# Patient Record
Sex: Female | Born: 1985 | Hispanic: No | Marital: Married | Smoking: Current every day smoker
Health system: Southern US, Community
[De-identification: ages and names within clinical notes are randomized; demographics above are authoritative.]

## PROBLEM LIST (undated history)

## (undated) DIAGNOSIS — F32A Depression, unspecified: Secondary | ICD-10-CM

## (undated) DIAGNOSIS — G43909 Migraine, unspecified, not intractable, without status migrainosus: Secondary | ICD-10-CM

## (undated) DIAGNOSIS — F329 Major depressive disorder, single episode, unspecified: Secondary | ICD-10-CM

## (undated) DIAGNOSIS — F1010 Alcohol abuse, uncomplicated: Secondary | ICD-10-CM

## (undated) HISTORY — DX: Migraine, unspecified, not intractable, without status migrainosus: G43.909

## (undated) HISTORY — DX: Alcohol abuse, uncomplicated: F10.10

## (undated) HISTORY — DX: Depression, unspecified: F32.A

## (undated) HISTORY — PX: APPENDECTOMY: SHX54

---
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
## 2008-12-04 HISTORY — PX: APPENDECTOMY: SHX54

## 2015-01-09 ENCOUNTER — Encounter (HOSPITAL_BASED_OUTPATIENT_CLINIC_OR_DEPARTMENT_OTHER): Payer: Self-pay | Admitting: *Deleted

## 2015-01-09 ENCOUNTER — Emergency Department (HOSPITAL_BASED_OUTPATIENT_CLINIC_OR_DEPARTMENT_OTHER)
Admission: EM | Admit: 2015-01-09 | Discharge: 2015-01-09 | Disposition: A | Discharge status: Home / Self Care | Payer: Self-pay | Attending: Emergency Medicine | Admitting: Emergency Medicine

## 2015-01-09 DIAGNOSIS — K088 Other specified disorders of teeth and supporting structures: Secondary | ICD-10-CM | POA: Insufficient documentation

## 2015-01-09 DIAGNOSIS — K0889 Other specified disorders of teeth and supporting structures: Secondary | ICD-10-CM

## 2015-01-09 DIAGNOSIS — K029 Dental caries, unspecified: Secondary | ICD-10-CM | POA: Insufficient documentation

## 2015-01-09 DIAGNOSIS — Z720 Tobacco use: Secondary | ICD-10-CM | POA: Insufficient documentation

## 2015-01-09 MED ORDER — PENICILLIN V POTASSIUM 500 MG PO TABS: 500.0000 mg | ORAL_TABLET | Freq: Three times a day (TID) | ORAL | Status: DC

## 2015-01-09 MED ORDER — HYDROCODONE-ACETAMINOPHEN 5-325 MG PO TABS: 1.0000 | ORAL_TABLET | Freq: Four times a day (QID) | ORAL | Status: DC | PRN

## 2015-01-09 NOTE — ED Provider Notes (Signed)
CSN: [id number]     Arrival date & time 01/09/15  1104 History   First MD Initiated Contact with Patient 01/09/15 1129     Chief Complaint  Patient presents with  . Dental Pain     (Consider location/radiation/quality/duration/timing/severity/associated sxs/prior Treatment) Patient is a 29 y.o. female presenting with tooth pain. The history is provided by the patient.  Dental Pain Location:  Upper Upper teeth location:  15/LU 2nd molar and 16/LU 3rd molar Quality:  Throbbing Severity:  Severe Onset quality:  Gradual Duration:  2 weeks Timing:  Constant Progression:  Worsening Chronicity:  New Context: not abscess   Relieved by:  Nothing Worsened by:  Nothing tried   History reviewed. No pertinent past medical history. Past Surgical History  Procedure Laterality Date  . Appendectomy     No family history on file. History  Substance Use Topics  . Smoking status: Current Every Day Smoker  . Smokeless tobacco: Not on file  . Alcohol Use: No   OB History    No data available     Review of Systems  All other systems reviewed and are negative.     Allergies  Review of patient's allergies indicates no known allergies.  Home Medications   Prior to Admission medications   Not on File   BP 135/96 mmHg  Pulse 97  Temp(Src) 99.7 F (37.6 C) (Oral)  Resp 18  Ht 5\' 5"  (1.651 m)  Wt 220 lb (99.791 kg)  BMI 36.61 kg/m2  SpO2 100% Physical Exam  Constitutional: She appears well-developed and well-nourished. No distress.  She appears nontoxic.  HENT:  Head: Normocephalic and atraumatic.  Mouth/Throat: Oropharynx is clear and moist.  The rare 2 molars on the left lower jaw are noted to be heavily decayed with several caries. There is surrounding gingival inflammation, however no obvious abscess is noted. There is no swelling or crepitus to the submental space and there is no adenopathy.  Neck: Normal range of motion. Neck supple.  Lymphadenopathy:    She has no  cervical adenopathy.  Skin: She is not diaphoretic.  Nursing note and vitals reviewed.   ED Course  Procedures (including critical care time) Labs Review Labs Reviewed - No data to display  Imaging Review No results found.   EKG Interpretation None      MDM   Final diagnoses:  None    We'll treat with antibiotics, pain meds, and follow-up with dentistry.    Geoffery Lyonsouglas Maxxwell Edgett, MD 01/09/15 1130

## 2015-01-09 NOTE — ED Notes (Signed)
Patient states she has a broken tooth, lower right side.

## 2015-01-09 NOTE — Discharge Instructions (Signed)
Penicillin as prescribed. Hydrocodone as prescribed as needed for pain.  Follow-up with a dentist next week. The contact information for Dr. Lucky Cowboy has been provided in this discharge summary. Call on Monday to arrange this appointment.  Return to the ER if your symptoms substantially worsen or change.   Dental Pain A tooth ache may be caused by cavities (tooth decay). Cavities expose the nerve of the tooth to air and hot or cold temperatures. It may come from an infection or abscess (also called a boil or furuncle) around your tooth. It is also often caused by dental caries (tooth decay). This causes the pain you are having. DIAGNOSIS  Your caregiver can diagnose this problem by exam. TREATMENT   If caused by an infection, it may be treated with medications which kill germs (antibiotics) and pain medications as prescribed by your caregiver. Take medications as directed.  Only take over-the-counter or prescription medicines for pain, discomfort, or fever as directed by your caregiver.  Whether the tooth ache today is caused by infection or dental disease, you should see your dentist as soon as possible for further care. SEEK MEDICAL CARE IF: The exam and treatment you received today has been provided on an emergency basis only. This is not a substitute for complete medical or dental care. If your problem worsens or new problems (symptoms) appear, and you are unable to meet with your dentist, call or return to this location. SEEK IMMEDIATE MEDICAL CARE IF:   You have a fever.  You develop redness and swelling of your face, jaw, or neck.  You are unable to open your mouth.  You have severe pain uncontrolled by pain medicine. MAKE SURE YOU:   Understand these instructions.  Will watch your condition.  Will get help right away if you are not doing well or get worse. Document Released: 11/20/2005 Document Revised: 02/12/2012 Document Reviewed: 07/08/2008 [facility] Patient Information  2015 [place], [place]. This information is not intended to replace advice given to you by your health care provider. Make sure you discuss any questions you have with your health care provider.    Emergency Department Resource Guide 1) Find a Doctor and Pay Out of Pocket Although you won't have to find out who is covered by your insurance plan, it is a good idea to ask around and get recommendations. You will then need to call the office and see if the doctor you have chosen will accept you as a new patient and what types of options they offer for patients who are self-pay. Some doctors offer discounts or will set up payment plans for their patients who do not have insurance, but you will need to ask so you aren't surprised when you get to your appointment.  2) Contact Your Local Health Department Not all health departments have doctors that can see patients for sick visits, but many do, so it is worth a call to see if yours does. If you don't know where your local health department is, you can check in your phone book. The CDC also has a tool to help you locate your state's health department, and many state websites also have listings of all of their local health departments.  3) Find a Walk-in Clinic If your illness is not likely to be very severe or complicated, you may want to try a walk in clinic. These are popping up all over the country in pharmacies, drugstores, and shopping centers. They're usually staffed by nurse practitioners or physician assistants that  have been trained to treat common illnesses and complaints. They're usually fairly quick and inexpensive. However, if you have serious medical issues or chronic medical problems, these are probably not your best option.  No Primary Care Doctor: - Call Health Connect at  [phone] - they can help you locate a primary care doctor that  accepts your insurance, provides certain services, etc. - Physician Referral Service-  [phone]  Chronic Pain Problems: Organization         Address  Phone   Notes  [facility]  [phone] Patients need to be referred by their primary care doctor.   Medication Assistance: Organization         Address  Phone   Notes  [facility] Medication [facility] [address] [place] [place], [place] [address] [phone] --Must be a resident of [facility] -- Must have NO insurance coverage whatsoever (no Medicaid/ Medicare, etc.) -- The pt. MUST have a primary care doctor that directs their care regularly and follows them in the community   Med[facility]ist  [phone]   [employer]  [phone]    Agencies that provide inexpensive medical care: Organization         Address  Phone   Notes  [facility]  [phone]   [facility]    [phone]   [facility] [address] [place], [place] [address] [phone]   [facility] [address] [place] [place] [phone]   Planned Parenthood    [phone]   [facility]    [phone]   [facility]  [address], [place] Phone:  [phone], Fax:  [phone] Hours of Operation:  9 am - 6 pm, M-F.  Also accepts Medicaid/Medicare and self-pay.  [facility]  [address], [place] Phone: [phone], Fax: [phone]. Hours of Operation:  8:30 am - 5:30 pm, M-F.  Also accepts Medicaid and self-pay.  [facility] [place] [address], [place] Phone: [phone]   [facility] [address] Cienna Bence [place], [place] [phone], Ext. [phone] Mondays & Thursdays: 7-9 AM.  First 15 patients are seen on a first come, first serve basis.    Medicaid-accepting [facility] Providers:  Organization         Address  Phone   Notes  [facility] [address],  [place] [phone] Also accepts self-pay patients.  [facility] [address] Laurell Josephs [place], [place]  [phone]   [facility] [address], [place] [phone]   [facility] [address], [place] [phone]   Renaye Rakers [address], [place]   [phone] Only accepts [place] Access [place] patients after they have their name applied to their card.   Self-Pay (no insurance) in [facility]:  Organization         Address  Phone   Notes  Sickle Cell Patients, [facility] [address] [place], [place] [phone]   [facility] Urgent Care [address] [place], [place] [phone]   [facility] [place]  [address],  [phone]   [facility]/Dr. Osei-Bonsu  [address] [place] [address], [place]  or [address], [address], [place] [phone] Phone number for both [facility] and [place] locations is the same.  [facility] [address], [place] [phone]   [facility] [place] [address] [place] [address], [place] or [address] [phone]   [facility] [address], [place] [phone]) [phone], phone; [phone](336) [phone], fax Sees patients 1st and 3rd Saturday of every month.  Must not qualify for public or private insurance (i.e. Medicaid, Medicare, [place] Health Choice, Veterans' Benefits)  Household income should be no more than 200% of the poverty level The clinic cannot treat you if you are pregnant or think you are pregnant  Sexually transmitted diseases are not treated at the clinic.    Dental Care: Organization         Address  Phone  Notes  [facility] [address], [place] [phone] Accepts children up to age 27 who are enrolled in  [place] or [place] Health Choice; pregnant women with a Medicaid card; and children who have applied for Medicaid or [place] Health Choice, but were declined, whose parents can pay a reduced fee at time of service.  [facility] [place]  [address], [place] [phone] Accepts children up to age 27 who are enrolled in [place] or [place] Health Choice; pregnant women with a Medicaid card; and children who have applied for Medicaid or [place] Health Choice, but were declined, whose parents can pay a reduced fee at time of service.  [facility]  [address], [place] [phone]) [phone] Patients are seen by appointment only. Walk-ins are not accepted. [facility] will see patients 27 years of age and older. Monday - Tuesday (8am-5pm) Most Wednesdays (8:30-5pm) $30 per visit, cash only  [facility]  [address], [facility] [phone] Patients are seen by appointment only. Walk-ins are not accepted. [facility] will see patients 27 years of age and older. One Wednesday Evening (Monthly: Volunteer Based).  $30 per visit, cash only  [employer] [facility] [facility]  [phone]) [phone] for adults; Children under age 294, call Graduate Pediatric Dentistry at [phone]. Children aged 254-14, please call [phone]) [phone] to request a pediatric application.  Dental services are provided in all areas of dental care including fillings, crowns and bridges, complete and partial dentures, implants, gum treatment, root canals, and extractions. Preventive care is also provided. Treatment is provided to both adults and children. Patients are selected via a lottery and there is often a waiting list.   [facility] [address], [place]  [phone] [email or web address]   [facility] [address], [place], [place] [phone], Ext. [phone] Second and Fourth Thursday of each month, opens at 6:30  AM; Clinic ends at 9 AM.  Patients are seen on a first-come first-served basis, and a limited number are seen during each clinic.   [facility]  [address], [place], [place] [phone]) [phone]   Eligibility Requirements You must have lived in [place], [place], or [place] counties for at least the last three months.   You cannot be eligible for state or federal sponsored [place] Cityhealthcare insurance, including CIGNAVeterans Administration, [place]icaid, or [employer]icare.   You generally cannot be eligible for healthcare insurance through your employer.    How to apply: Eligibility  screenings are held every Tuesday and Wednesday afternoon from 1:00 pm until 4:00 pm. You do not need an appointment for the interview!  [facility] [address] [place], [place] [phone]   [facility] Health Department  [phone]   [facility] Health Department  [phone]    [facility] Health Department  [phone]    Behavioral Health Resources in the Community: Intensive Outpatient Programs Organization         Address  Phone  Notes  [facility] [address] [place], [place] [phone]   [facility] Outpatient [address] [place], [place] [phone]   [facility] [address] [place], [place]  [phone]   [facility] Mental Health [address]  [place], [place] [phone] or [phone]   Substance Abuse Resources Organization         Address  Phone  Notes  Alcohol and Drug Services  [phone]   [facility]  [phone]   [facility]  [phone]   Floydene Flock  [phone]   Residential & Outpatient Substance Abuse Program  [phone]   Psychological Services Organization         Address  Phone  Notes  [facility] Health  [phone]   [facility] Services  [phone]   [facility] Mental Health [address], [place] [phone] or  [phone]    Mobile Crisis Teams Organization         Address  Phone  Notes  [facility], Mobile Crisis Care Unit  [phone]   [facility]  [address] [employer], [place] [phone]   Doristine Locks [address] [place] [phone]    Self-Help/Support Groups Organization         Address  Phone             Notes  [facility] [place] - variety of support groups  [phone]- I7437963 Call for more information  Narcotics Anonymous (NA), [facility] [address], [employer] Pink  2 meetings at this location   Statistician         Address  Phone  Notes  [facility] [address],    [place]  [phone]   [facility]  [address] [place] [address], [place], [place] [phone]   [facility] [place] [facility] [address] [place], [place] [phone] Admissions: 8am-3pm M-F  [facility] [address],    [place], [place] [phone]   [facility] [address] [place], [place], [place] [phone]   [facility] [address]  [place], [place] [phone]   [facility] - Intensive Outpatient [address], [place], [place] [phone]   [facility] ([facility] [address] [place].,  [place], [place] [phone] or [phone]   [facility] ([facility]) [address], [place], [place] [phone] Accepts Medicaid  [facility] [address]  [place] [phone] Substance Abuse/Addiction Treatment   [facility] Organization         Address  Phone  Notes  [facility]  [phone]   Angie Fava, PhD [address] Ervin Knack [place], [place]   [phone] or [phone]   [facility] Behavioral   [address] [place], [place] [phone]   [facility] [address],  [place], [place] [phone] Insurance/Medicaid/sponsorship through [facility] and Families [address], [place], [place] [phone] [place], [place] [phone]    Dr. Adele Schilder  [phone]   [facility] Dept. 1) [address], [place] 2) [place] 3)  [address], [place] [phone]  [phone]   [place] [phone] or [phone] (After Hours)

## 2015-04-09 ENCOUNTER — Encounter (HOSPITAL_BASED_OUTPATIENT_CLINIC_OR_DEPARTMENT_OTHER): Payer: Self-pay

## 2015-04-09 ENCOUNTER — Emergency Department (HOSPITAL_BASED_OUTPATIENT_CLINIC_OR_DEPARTMENT_OTHER)
Admission: EM | Admit: 2015-04-09 | Discharge: 2015-04-09 | Disposition: A | Discharge status: Home / Self Care | Payer: Self-pay | Attending: Emergency Medicine | Admitting: Emergency Medicine

## 2015-04-09 ENCOUNTER — Emergency Department (HOSPITAL_BASED_OUTPATIENT_CLINIC_OR_DEPARTMENT_OTHER): Payer: Self-pay

## 2015-04-09 DIAGNOSIS — Z792 Long term (current) use of antibiotics: Secondary | ICD-10-CM | POA: Insufficient documentation

## 2015-04-09 DIAGNOSIS — J209 Acute bronchitis, unspecified: Secondary | ICD-10-CM | POA: Insufficient documentation

## 2015-04-09 DIAGNOSIS — J40 Bronchitis, not specified as acute or chronic: Secondary | ICD-10-CM

## 2015-04-09 DIAGNOSIS — Z720 Tobacco use: Secondary | ICD-10-CM | POA: Insufficient documentation

## 2015-04-09 MED ORDER — ALBUTEROL SULFATE HFA 108 (90 BASE) MCG/ACT IN AERS: 2.0000 | INHALATION_SPRAY | RESPIRATORY_TRACT | Status: DC | PRN

## 2015-04-09 MED ORDER — AZITHROMYCIN 250 MG PO TABS: 250.0000 mg | ORAL_TABLET | Freq: Every day | ORAL | Status: DC

## 2015-04-09 MED ORDER — ALBUTEROL SULFATE HFA 108 (90 BASE) MCG/ACT IN AERS
2.0000 | INHALATION_SPRAY | Freq: Once | RESPIRATORY_TRACT | Status: AC
  Administered 2015-04-09: 2 via RESPIRATORY_TRACT

## 2015-04-09 MED ORDER — ALBUTEROL SULFATE HFA 108 (90 BASE) MCG/ACT IN AERS
INHALATION_SPRAY | RESPIRATORY_TRACT | Status: AC
  Filled 2015-04-09: qty 6.7

## 2015-04-09 NOTE — ED Notes (Signed)
Pt reports three week history of cough with white sputum production - denies fever, also reports runny nose.

## 2015-04-09 NOTE — Discharge Instructions (Signed)
Return to the emergency room with worsening of symptoms, new symptoms or with symptoms that are concerning , especially fevers, stiff neck, worsening headache, nausea/vomiting, visual changes or slurred speech, chest pain, shortness of breath, cough with thick colored mucous or blood Drink plenty of fluids with electrolytes especially Gatorade. OTC cold medications such as mucinex, nyquil, dayquil are recommended. Chloraseptic for sore throat. Albuterol inhaler for cough. Please take all of your antibiotics until finished!   You may develop abdominal discomfort or diarrhea from the antibiotic.  You may help offset this with probiotics which you can buy or get in yogurt. Do not eat  or take the probiotics until 2 hours after your antibiotic.  STOP Smoking! Please call your doctor for a followup appointment within 24-48 hours. When you talk to your doctor please let them know that you were seen in the emergency department and have them acquire all of your records so that they can discuss the findings with you and formulate a treatment plan to fully care for your new and ongoing problems. If you do not have a primary care provider please call the number below under ED resources to establish care with a provider and follow up.   Emergency Department Resource Guide 1) Find a Doctor and Pay Out of Pocket Although you won't have to find out who is covered by your insurance plan, it is a good idea to ask around and get recommendations. You will then need to call the office and see if the doctor you have chosen will accept you as a new patient and what types of options they offer for patients who are self-pay. Some doctors offer discounts or will set up payment plans for their patients who do not have insurance, but you will need to ask so you aren't surprised when you get to your appointment.  2) Contact Your Local Health Department Not all health departments have doctors that can see patients for sick visits,  but many do, so it is worth a call to see if yours does. If you don't know where your local health department is, you can check in your phone book. The CDC also has a tool to help you locate your state's health department, and many state websites also have listings of all of their local health departments.  3) Find a Walk-in Clinic If your illness is not likely to be very severe or complicated, you may want to try a walk in clinic. These are popping up all over the country in pharmacies, drugstores, and shopping centers. They're usually staffed by nurse practitioners or physician assistants that have been trained to treat common illnesses and complaints. They're usually fairly quick and inexpensive. However, if you have serious medical issues or chronic medical problems, these are probably not your best option.  No Primary Care Doctor: - Call Health Connect at  [phone] - they can help you locate a primary care doctor that  accepts your insurance, provides certain services, etc. - Physician Referral Service- [phone]  Chronic Pain Problems: Organization         Address  Phone   Notes  [facility]  [phone] Patients need to be referred by their primary care doctor.   Medication Assistance: Organization         Address  Phone   Notes  [facility] [address] [place] [place], [place] [address] [phone] --Must be a resident of [facility] -- Must have  NO insurance coverage whatsoever (no Medicaid/ Medicare, etc.) -- The pt. MUST have a primary care doctor that directs their care regularly and follows them in the community   [facility]ist  [phone]   [employer]  [phone]    Agencies that provide inexpensive medical care: Organization         Address  Phone   Notes  [facility]  [phone]   [facility]    [phone]   [facility] [address] [place], [place] [address] [phone]   [facility] [address] [place], [place] [phone]   Planned Parenthood    [phone]   [facility]    [phone]   [facility]  [address], [place] Phone:  [phone], Fax:  [phone] Hours of Operation:  9 am - 6 pm, M-F.  Also accepts Medicaid/Medicare and self-pay.  [facility]  [address], [place] Phone: [phone], Fax: [phone]. Hours of Operation:  8:30 am - 5:30 pm, M-F.  Also accepts Medicaid and self-pay.  [facility] [place] [address], [place] Phone: [phone]   [facility] [address] Laina Bence [place], [place] [phone], Ext. [phone] Mondays & Thursdays: 7-9 AM.  First 15 patients are seen on a first come, first serve basis.    Medicaid-accepting [facility] Providers:  Organization         Address  Phone   Notes  [facility] [address], [place] [phone] Also accepts self-pay patients.  [facility] [address] Laurell Josephs [place], [place]  [phone]   [facility] [address], [place] [phone]   [facility] [address] [place] [phone]   Renaye Rakers [address], [place]   [phone] Only accepts [place] patients after they have their name applied to their card.   Self-Pay (no insurance) in [facility]:  Organization         Address  Phone   Notes  Sickle Cell Patients, [facility] [address] [place], [place] [phone]   [facility] [address] [place], [place] [phone]   [facility] [place]  [address] [place] [address], [place] [phone]   [facility]/Dr. Osei-Bonsu  [address] [place] [address]  [place] or [address], [place] [phone] Phone number for both [place] and [place] locations is the same.  [facility] [address] [place] [phone]   [facility] [address], [place] or [address] [phone]   [facility] [address], [place] [phone], phone; [phone], fax Sees patients 1st and 3rd Saturday of every month.  Must not qualify for public or private insurance (i.e. Medicaid, Medicare, [place] Health Choice, Veterans' Benefits)  Household income should be no more than 200% of the poverty level The clinic cannot treat you if you are pregnant or think you are pregnant  Sexually transmitted diseases are not treated at the clinic.    Dental Care: Organization         Address  Phone  Notes  [facility] [address], [place] [phone] Accepts children up to age 27 who are enrolled in [place]icaid or [place] Health Choice; pregnant women with a Medicaid card; and children who have applied for Medicaid or [place] Health Choice, but were declined, whose parents can pay a reduced fee at time of service.  [facility] [place]  [address], [place] [phone]) [phone] Accepts children up to age 27 who are enrolled in [place] or [place] Health Choice; pregnant women with a Medicaid card; and children who have applied for Medicaid or  Health Choice, but were declined, whose parents can pay a reduced fee at time of service.  [facility]  [address], [place] [phone] Patients are seen by appointment only. Walk-ins are not accepted. [facility] will see patients 27 years of age and older. Monday - Tuesday (8am-5pm) Most Wednesdays (8:30-5pm) $30 per visit, cash only  [facility]  [address], [facility] [place] [phone] Patients are seen by appointment only. Walk-ins are not accepted. [facility] will see patients 27 years of age and older. One Wednesday Evening (Monthly: Volunteer Based).  $30 per visit, cash only  [employer] [facility]  [phone] for adults; Children under age 264, call Graduate Pediatric Dentistry at [phone]) [phone]. Children aged 644-14, please call [phone] to request a pediatric application.  Dental services are provided in all areas of dental care including fillings, crowns and bridges, complete and partial dentures, implants, gum treatment, root canals, and extractions. Preventive care is also provided. Treatment is provided to both adults and children. Patients are selected via a lottery and there is often a waiting list.   [facility] [address], [place]  [phone] [email or web address]   [facility] [address], [place], [place] [phone], Ext. [phone] Second and Fourth Thursday of each month, opens at 6:30 AM; Clinic ends at 9 AM.  Patients are seen on a first-come first-served basis, and a limited number are seen during each clinic.   [facility]  [address], [place], [place] [phone]   Eligibility Requirements You must have lived in [place], [place], or [place] for at least the last three months.   You cannot be eligible for state or federal sponsored [place] Cityhealthcare insurance, including CIGNAVeterans Administration, [place]icaid, or [employer]Medicare.   You generally cannot be eligible for healthcare insurance through your employer.    How to apply: Eligibility screenings are held every Tuesday and Wednesday afternoon from 1:00 pm until 4:00 pm. You do not need an appointment for the interview!  [facility] [address], [place], [place] [phone]   [facility]  [phone]   [facility]  [phone]   [facility]  [phone]    Behavioral Health Resources in the Community: Intensive Outpatient Programs Organization         Address  Phone  Notes  [facility] [address], [place], [place] [phone]   [facility] [address], [place], [place] [phone]   [facility] [address], [place], [place]  [phone]   [facility] [address],  [place], [place] [phone] or [phone]   Substance Abuse Resources Organization  Address  Phone  Notes  [facility]  [phone]   [facility]  [phone]   [facility]  [phone]   [facility]  [phone]   Residential & Outpatient Substance Abuse Program  [phone]   Psychological Services Organization         Address  Phone  Notes  [facility]  [phone]   [facility]  [phone]   [facility] [address], [place] [phone] or [phone]    Mobile Crisis Teams Organization         Address  Phone  Notes  [facility], Mobile Crisis Care Unit  [phone]   [facility]  [address] [place], [place] [phone]   Doristine LocksSharon DeEsch [address] [place] [phone]    Self-Help/Support Groups Organization         Address  Phone             Notes  [facility] [place] - variety of support groups  [phone] Call for more information  Narcotics Anonymous (NA), [facility] [address], [place]  2 meetings at this location   Statisticianesidential Treatment Programs Organization         Address  Phone  Notes  [facility] [address],    [place]  [phone]   [facility]  [address], [place] [phone], [place], [place] [phone]   [facility] [address], [place]  [phone] Admissions: 8am-3pm M-F  [facility] [address]    [place], [place] [phone]   [facility] [address], [place], [place] [phone]   [facility] [address]  [place], [place] [phone]   [facility] - Intensive Outpatient [address], [place], [place] [phone]   [facility] ([facility] [address]  [place], [place] [phone] or [phone]   [facility] ([facility]) [address] [place], [place] [phone] Accepts Medicaid  [facility] [address]  [place] [phone] Substance Abuse/Addiction Treatment   [facility] Resources Organization         Address  Phone  Notes  [facility]  [phone]   Angie FavaJulie Brannon, PhD [address] [place], [place]   [phone] or [phone](336) [phone]   [facility]   [address] [place], [place] [phone]) [phone]   [facility] [address], [place], [place] [phone]) [phone] Insurance/Medicaid/sponsorship through [facility]  [facility] Families [address]                                    [place], [place] [phone]) [phone] Therapy/tele-psych/case  [facility] [address].   [place], [place] [phone]    Dr. Lolly MustacheArfeen  [phone]   [facility]  [employer] [facility] 1) [address],  2) [address], [place] 3)  [address], [place] [phone]  [phone](336) [phone]   [facility] Child Abuse Hotline [phone]) 5020058698 or [phone] (After Hours)

## 2015-04-09 NOTE — ED Provider Notes (Signed)
CSN: [id number]     Arrival date & time 04/09/15  1426 History   First MD Initiated Contact with Patient 04/09/15 1611     Chief Complaint  Patient presents with  . Cough     (Consider location/radiation/quality/duration/timing/severity/associated sxs/prior Treatment) HPI  Frances Powell is a 29 y.o. female presenting with 3 week history of cough productive of thick white sputum with associated runny nose and congestion. She denies alleviating or aggravating factors. Patient denies fevers, chills. She denies chest pain or shortness of breath. Patient does reports posttussive emesis at times, rates her abdomen that quickly resolves. Patient does report sore throat. Patient is not taken anything for her symptoms. She's never had these symptoms before. Patient is current smoker and does endorse marijuana use. No hemoptysis or peripheral edema.   History reviewed. No pertinent past medical history. Past Surgical History  Procedure Laterality Date  . Appendectomy     History reviewed. No pertinent family history. History  Substance Use Topics  . Smoking status: Current Every Day Smoker  . Smokeless tobacco: Not on file  . Alcohol Use: Yes     Comment: weekly   OB History    No data available     Review of Systems  Constitutional: Negative for fever and chills.  HENT: Positive for congestion, rhinorrhea and sore throat.   Respiratory: Positive for cough. Negative for shortness of breath.   Gastrointestinal: Negative for nausea, vomiting and diarrhea.      Allergies  Review of patient's allergies indicates no known allergies.  Home Medications   Prior to Admission medications   Medication Sig Start Date End Date Taking? Authorizing Provider  albuterol (PROVENTIL HFA;VENTOLIN HFA) 108 (90 BASE) MCG/ACT inhaler Inhale 2 puffs into the lungs every 4 (four) hours as needed for wheezing or shortness of breath. 04/09/15   Oswaldo ConroyVictoria Trinady Milewski, PA-C  azithromycin (ZITHROMAX) 250 MG tablet  Take 1 tablet (250 mg total) by mouth daily. Take first 2 tablets together, then 1 every day until finished. 04/09/15   Oswaldo ConroyVictoria Alias Villagran, PA-C  HYDROcodone-acetaminophen (NORCO) 5-325 MG per tablet Take 1-2 tablets by mouth every 6 (six) hours as needed. 01/09/15   Geoffery Lyonsouglas Delo, MD  penicillin v potassium (VEETID) 500 MG tablet Take 1 tablet (500 mg total) by mouth 3 (three) times daily. 01/09/15   Geoffery Lyonsouglas Delo, MD   BP 128/64 mmHg  Pulse 95  Temp(Src) 98.2 F (36.8 C) (Oral)  Resp 18  Ht 5\' 4"  (1.626 m)  Wt 220 lb (99.791 kg)  BMI 37.74 kg/m2  SpO2 100%  LMP 04/03/2015 Physical Exam  Constitutional: She appears well-developed and well-nourished. No distress.  HENT:  Head: Normocephalic and atraumatic.  Nose: Right sinus exhibits no maxillary sinus tenderness and no frontal sinus tenderness. Left sinus exhibits no maxillary sinus tenderness and no frontal sinus tenderness.  Mouth/Throat: Mucous membranes are normal. Posterior oropharyngeal edema and posterior oropharyngeal erythema present. No oropharyngeal exudate.  No trismus or uvula deviation. Cobblestoning of oropharynx.  Eyes: Conjunctivae and EOM are normal. Right eye exhibits no discharge. Left eye exhibits no discharge.  Neck: Normal range of motion. Neck supple.  Cardiovascular: Normal rate, regular rhythm and normal heart sounds.   Pulmonary/Chest: Effort normal and breath sounds normal. No respiratory distress. She has no wheezes. She has no rales.  Abdominal: Soft. Bowel sounds are normal. She exhibits no distension. There is no tenderness.  Lymphadenopathy:    She has cervical adenopathy.  Neurological: She is alert.  Skin: Skin is warm  and dry. She is not diaphoretic.  Nursing note and vitals reviewed.   ED Course  Procedures (including critical care time) Labs Review Labs Reviewed - No data to display  Imaging Review Dg Chest 2 View  04/09/2015   CLINICAL DATA:  Cough for 3 weeks  EXAM: CHEST  2 VIEW  COMPARISON:   None.  FINDINGS: Cardiomediastinal silhouette is unremarkable. No acute infiltrate or pleural effusion. No pulmonary edema. Bony thorax is unremarkable.  IMPRESSION: No active cardiopulmonary disease.   Electronically Signed   By: Illona MeadLiviu  Pop M.D.   On: 04/09/2015 15:13     EKG Interpretation None      MDM   Final diagnoses:  Bronchitis   Patient with three-week history of cough productive of thick sputum without fevers. She has chronic smoker. Patient denies shortness of breath or chest pain. Chest x-ray without evidence of pneumonia. Stressed importance of smoking cessation and patient given referral to the wellness center to establish care with primary care provider and discuss further therapeutic smoking cessation strategies. Patient given azithromycin for likely bronchitis and albuterol inhaler in the ED.  Discussed return precautions with patient. Discussed all results and patient verbalizes understanding and agrees with plan.    Oswaldo ConroyVictoria Latrise Bowland, PA-C 04/09/15 1704  Layla MawKristen N Ward, DO 04/09/15 1708

## 2017-01-22 ENCOUNTER — Emergency Department (HOSPITAL_BASED_OUTPATIENT_CLINIC_OR_DEPARTMENT_OTHER): Payer: No Typology Code available for payment source

## 2017-01-22 ENCOUNTER — Encounter (HOSPITAL_BASED_OUTPATIENT_CLINIC_OR_DEPARTMENT_OTHER): Payer: Self-pay | Admitting: Emergency Medicine

## 2017-01-22 ENCOUNTER — Emergency Department (HOSPITAL_BASED_OUTPATIENT_CLINIC_OR_DEPARTMENT_OTHER)
Admission: EM | Admit: 2017-01-22 | Discharge: 2017-01-23 | Disposition: A | Discharge status: Home / Self Care | Payer: No Typology Code available for payment source | Attending: Emergency Medicine | Admitting: Emergency Medicine

## 2017-01-22 DIAGNOSIS — S199XXA Unspecified injury of neck, initial encounter: Secondary | ICD-10-CM | POA: Diagnosis present

## 2017-01-22 DIAGNOSIS — S300XXA Contusion of lower back and pelvis, initial encounter: Secondary | ICD-10-CM | POA: Diagnosis not present

## 2017-01-22 DIAGNOSIS — R51 Headache: Secondary | ICD-10-CM | POA: Diagnosis not present

## 2017-01-22 DIAGNOSIS — S7002XA Contusion of left hip, initial encounter: Secondary | ICD-10-CM | POA: Insufficient documentation

## 2017-01-22 DIAGNOSIS — Y999 Unspecified external cause status: Secondary | ICD-10-CM | POA: Diagnosis not present

## 2017-01-22 DIAGNOSIS — Y939 Activity, unspecified: Secondary | ICD-10-CM | POA: Diagnosis not present

## 2017-01-22 DIAGNOSIS — Y92410 Unspecified street and highway as the place of occurrence of the external cause: Secondary | ICD-10-CM | POA: Insufficient documentation

## 2017-01-22 DIAGNOSIS — F17200 Nicotine dependence, unspecified, uncomplicated: Secondary | ICD-10-CM | POA: Diagnosis not present

## 2017-01-22 DIAGNOSIS — S161XXA Strain of muscle, fascia and tendon at neck level, initial encounter: Secondary | ICD-10-CM | POA: Diagnosis not present

## 2017-01-22 DIAGNOSIS — T148XXA Other injury of unspecified body region, initial encounter: Secondary | ICD-10-CM
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
## 2017-01-22 NOTE — ED Notes (Signed)
ED Provider at bedside. 

## 2017-01-22 NOTE — ED Notes (Signed)
Pt instructed to change in to gown for eval

## 2017-01-23 MED ORDER — MELOXICAM 15 MG PO TABS: 15.0000 mg | ORAL_TABLET | Freq: Every day | ORAL | 0 refills | Status: DC

## 2017-01-23 MED ORDER — BACLOFEN 10 MG PO TABS: 10.0000 mg | ORAL_TABLET | Freq: Three times a day (TID) | ORAL | 0 refills | Status: DC

## 2017-01-23 MED ORDER — NAPROXEN 250 MG PO TABS
500.0000 mg | ORAL_TABLET | Freq: Once | ORAL | Status: AC
  Administered 2017-01-23: 500 mg via ORAL
  Filled 2017-01-23: qty 2

## 2017-01-23 NOTE — Discharge Instructions (Signed)
Your imaging was negative. ° °Return to the emergency department immediately if you develop any of the following symptoms: °You have numbness, tingling, or weakness in the arms or legs. °You develop severe headaches not relieved with medicine. °You have severe neck pain, especially tenderness in the middle of the back of your neck. °You have changes in bowel or bladder control. °There is increasing pain in any area of the body. °You have shortness of breath, light-headedness, dizziness, or fainting. °You have chest pain. °You feel sick to your stomach (nauseous), throw up (vomit), or sweat. °You have increasing abdominal discomfort. °There is blood in your urine, stool, or vomit. °You have pain in your shoulder (shoulder strap areas). °You feel your symptoms are getting worse. ° °

## 2017-03-13 ENCOUNTER — Emergency Department (HOSPITAL_BASED_OUTPATIENT_CLINIC_OR_DEPARTMENT_OTHER)
Admission: EM | Admit: 2017-03-13 | Discharge: 2017-03-13 | Disposition: A | Discharge status: Home / Self Care | Payer: Self-pay | Attending: Emergency Medicine | Admitting: Emergency Medicine

## 2017-03-13 ENCOUNTER — Encounter (HOSPITAL_BASED_OUTPATIENT_CLINIC_OR_DEPARTMENT_OTHER): Payer: Self-pay | Admitting: *Deleted

## 2017-03-13 DIAGNOSIS — F17200 Nicotine dependence, unspecified, uncomplicated: Secondary | ICD-10-CM | POA: Insufficient documentation

## 2017-03-13 DIAGNOSIS — K0889 Other specified disorders of teeth and supporting structures: Secondary | ICD-10-CM | POA: Insufficient documentation

## 2017-03-13 MED ORDER — PENICILLIN V POTASSIUM 500 MG PO TABS: 500.0000 mg | ORAL_TABLET | Freq: Four times a day (QID) | ORAL | 0 refills | Status: AC

## 2017-03-13 MED ORDER — BUPIVACAINE-EPINEPHRINE (PF) 0.5% -1:200000 IJ SOLN
1.8000 mL | Freq: Once | INTRAMUSCULAR | Status: AC
  Administered 2017-03-13: 1.8 mL
  Filled 2017-03-13: qty 1.8

## 2017-03-13 MED ORDER — HYDROCODONE-ACETAMINOPHEN 5-325 MG PO TABS: 1.0000 | ORAL_TABLET | Freq: Four times a day (QID) | ORAL | 0 refills | Status: DC | PRN

## 2017-03-13 NOTE — ED Provider Notes (Addendum)
[facility] Provider Note: Lowella Dell, MD, FACEP  CSN: [id number] MRN: [id number] ARRIVAL: 03/13/17 at 0351 ROOM: [facility]   CHIEF COMPLAINT  Dental Pain   HISTORY OF PRESENT ILLNESS  Frances Powell is a 31 y.o. female with a known fractured right upper second molar. She has been having pain in her right upper second molar and her right lower first molar for over 2 months. The pain had not been severe until this morning. The pain is more severe in the upper tooth but the pain blends together and she has difficulty separating the two. Pain is worse with movement or attempted eating or drinking. She rates her pain as a 10 out of 10. She has taken ibuprofen without relief. She has a dentist whom she has seen for this. Her dentist wondered her to be treated by an oral surgeon but she states it was too expensive.  Consultation with the [facility] state controlled substances database reveals the patient has received no opioid prescriptions in the past year.   History reviewed. No pertinent past medical history.  Past Surgical History:  Procedure Laterality Date  . APPENDECTOMY      No family history on file.  Social History  Substance Use Topics  . Smoking status: Current Every Day Smoker  . Smokeless tobacco: Never Used  . Alcohol use Yes     Comment: weekly    Prior to Admission medications   Medication Sig Start Date End Date Taking? Authorizing Provider  baclofen (LIORESAL) 10 MG tablet Take 1 tablet (10 mg total) by mouth 3 (three) times daily. 01/23/17   Arthor Captain, PA-C  meloxicam (MOBIC) 15 MG tablet Take 1 tablet (15 mg total) by mouth daily. Take 1 daily with food. 01/23/17   Arthor Captain, PA-C    Allergies Patient has no known allergies.   REVIEW OF SYSTEMS  Negative except as noted here or in the History of Present Illness.   PHYSICAL EXAMINATION  Initial Vital Signs Blood pressure (!) 157/108, pulse 64, temperature 97.7 F (36.5  C), temperature source Oral, resp. rate 20, height  (1.626 m), weight 200 lb (90.7 kg), last menstrual period 03/03/2017, SpO2 99 %.  Examination General: Well-developed, well-nourished female in no acute distress; appearance consistent with age of record HENT: normocephalic; atraumatic; fractured right upper second molar with tenderness to percussion and mild adjacent soft tissue swelling; right lower first molar with amalgam restoration, tender to percussion; Eyes: Normal appearance Neck: supple Heart: regular rate and rhythm Lungs: Normal respiratory effort and excursion Abdomen: soft; nondistended Extremities: No deformity; full range of motion Neurologic: Awake, alert and oriented; motor function intact in all extremities and symmetric; no facial droop Skin: Warm and dry Psychiatric: Normal mood and affect   RESULTS  Summary of this visit's results, reviewed by myself:   EKG Interpretation  Date/Time:    Ventricular Rate:    PR Interval:    QRS Duration:   QT Interval:    QTC Calculation:   R Axis:     Text Interpretation:        Laboratory Studies: No results found for this or any previous visit (from the past 24 hour(s)). Imaging Studies: No results found.  ED COURSE  Nursing notes and initial vitals signs, including pulse oximetry, reviewed.  Vitals:   03/13/17 0404  BP: (!) 157/108  Pulse: 64  Resp: 20  Temp: 97.7 F (36.5 C)  TempSrc: Oral  SpO2: 99%  Weight: 200 lb (90.7  kg)  Height:  (1.626 m)    PROCEDURES   DENTAL BLOCK 1.8 milliliters of 0.5% bupivacaine with epinephrine were injected into the buccal fold adjacent to the right upper second molar. The patient tolerated this well and there were no immediate complications. Adequate analgesia was obtained.  DENTAL BLOCK 1.8 milliliters of 0.5% bupivacaine with epinephrine were injected into the buccal fold adjacent to the right lower first molar. The patient tolerated this well and  there were no immediate complications. Adequate analgesia was obtained.   ED DIAGNOSES     ICD-9-CM ICD-10-CM   1. Pain, dental 525.9 K08.89        Paula Libra, MD 03/13/17 0430    Paula Libra, MD 03/13/17 [phone]

## 2017-03-13 NOTE — ED Triage Notes (Signed)
c/o rt upper and lower tooth pain x 2 months

## 2017-07-07 ENCOUNTER — Emergency Department (HOSPITAL_BASED_OUTPATIENT_CLINIC_OR_DEPARTMENT_OTHER): Payer: No Typology Code available for payment source

## 2017-07-07 ENCOUNTER — Encounter (HOSPITAL_BASED_OUTPATIENT_CLINIC_OR_DEPARTMENT_OTHER): Payer: Self-pay | Admitting: Emergency Medicine

## 2017-07-07 ENCOUNTER — Emergency Department (HOSPITAL_BASED_OUTPATIENT_CLINIC_OR_DEPARTMENT_OTHER)
Admission: EM | Admit: 2017-07-07 | Discharge: 2017-07-07 | Disposition: A | Discharge status: Home / Self Care | Payer: No Typology Code available for payment source | Attending: Emergency Medicine | Admitting: Emergency Medicine

## 2017-07-07 DIAGNOSIS — R1110 Vomiting, unspecified: Secondary | ICD-10-CM | POA: Insufficient documentation

## 2017-07-07 DIAGNOSIS — M25512 Pain in left shoulder: Secondary | ICD-10-CM | POA: Insufficient documentation

## 2017-07-07 DIAGNOSIS — F17210 Nicotine dependence, cigarettes, uncomplicated: Secondary | ICD-10-CM | POA: Diagnosis not present

## 2017-07-07 DIAGNOSIS — Y93I9 Activity, other involving external motion: Secondary | ICD-10-CM | POA: Insufficient documentation

## 2017-07-07 DIAGNOSIS — H9312 Tinnitus, left ear: Secondary | ICD-10-CM | POA: Diagnosis not present

## 2017-07-07 DIAGNOSIS — Y999 Unspecified external cause status: Secondary | ICD-10-CM | POA: Diagnosis not present

## 2017-07-07 DIAGNOSIS — H538 Other visual disturbances: Secondary | ICD-10-CM | POA: Insufficient documentation

## 2017-07-07 DIAGNOSIS — Z79899 Other long term (current) drug therapy: Secondary | ICD-10-CM | POA: Insufficient documentation

## 2017-07-07 DIAGNOSIS — Y929 Unspecified place or not applicable: Secondary | ICD-10-CM | POA: Diagnosis not present

## 2017-07-07 DIAGNOSIS — M79602 Pain in left arm: Secondary | ICD-10-CM | POA: Diagnosis not present

## 2017-07-07 DIAGNOSIS — M549 Dorsalgia, unspecified: Secondary | ICD-10-CM | POA: Insufficient documentation

## 2017-07-07 DIAGNOSIS — M542 Cervicalgia: Secondary | ICD-10-CM | POA: Diagnosis not present

## 2017-07-07 DIAGNOSIS — R079 Chest pain, unspecified: Secondary | ICD-10-CM | POA: Diagnosis not present

## 2017-07-07 LAB — PREGNANCY, URINE: Preg Test, Ur: NEGATIVE

## 2017-07-07 MED ORDER — IBUPROFEN 800 MG PO TABS: 800.0000 mg | ORAL_TABLET | Freq: Three times a day (TID) | ORAL | 0 refills | Status: DC

## 2017-07-07 MED ORDER — ACETAMINOPHEN 500 MG PO TABS: 500.0000 mg | ORAL_TABLET | Freq: Four times a day (QID) | ORAL | 0 refills | Status: DC | PRN

## 2017-07-07 MED ORDER — CYCLOBENZAPRINE HCL 10 MG PO TABS: 10.0000 mg | ORAL_TABLET | Freq: Two times a day (BID) | ORAL | 0 refills | Status: DC | PRN

## 2017-07-07 MED ORDER — IOPAMIDOL (ISOVUE-300) INJECTION 61%
100.0000 mL | Freq: Once | INTRAVENOUS | Status: AC | PRN
  Administered 2017-07-07: 80 mL via INTRAVENOUS

## 2017-07-07 NOTE — ED Notes (Signed)
Patient transported to CT 

## 2017-07-07 NOTE — ED Notes (Signed)
Patient transported to X-ray 
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
## 2017-07-07 NOTE — ED Notes (Signed)
ED Provider at bedside. 

## 2018-01-25 ENCOUNTER — Other Ambulatory Visit: Payer: Self-pay

## 2018-01-25 ENCOUNTER — Emergency Department (HOSPITAL_BASED_OUTPATIENT_CLINIC_OR_DEPARTMENT_OTHER)
Admission: EM | Admit: 2018-01-25 | Discharge: 2018-01-25 | Disposition: A | Discharge status: Home / Self Care | Payer: Self-pay | Attending: Emergency Medicine | Admitting: Emergency Medicine

## 2018-01-25 ENCOUNTER — Encounter (HOSPITAL_BASED_OUTPATIENT_CLINIC_OR_DEPARTMENT_OTHER): Payer: Self-pay | Admitting: *Deleted

## 2018-01-25 DIAGNOSIS — M545 Low back pain, unspecified: Secondary | ICD-10-CM

## 2018-01-25 DIAGNOSIS — Z79899 Other long term (current) drug therapy: Secondary | ICD-10-CM | POA: Insufficient documentation

## 2018-01-25 DIAGNOSIS — F17210 Nicotine dependence, cigarettes, uncomplicated: Secondary | ICD-10-CM | POA: Insufficient documentation

## 2018-01-25 LAB — URINALYSIS, MICROSCOPIC (REFLEX): WBC UA: NONE SEEN WBC/hpf (ref 0–5)

## 2018-01-25 LAB — URINALYSIS, ROUTINE W REFLEX MICROSCOPIC
Bilirubin Urine: NEGATIVE
Glucose, UA: NEGATIVE mg/dL
Ketones, ur: NEGATIVE mg/dL
LEUKOCYTES UA: NEGATIVE
Nitrite: NEGATIVE
Protein, ur: NEGATIVE mg/dL
pH: 6.5 (ref 5.0–8.0)

## 2018-01-25 LAB — PREGNANCY, URINE: Preg Test, Ur: NEGATIVE

## 2018-01-25 MED ORDER — METHOCARBAMOL 500 MG PO TABS: 500.0000 mg | ORAL_TABLET | Freq: Two times a day (BID) | ORAL | 0 refills | Status: DC

## 2018-01-25 MED ORDER — LIDOCAINE 5 % EX PTCH: 1.0000 | MEDICATED_PATCH | CUTANEOUS | 0 refills | Status: DC

## 2018-01-25 MED ORDER — METHOCARBAMOL 500 MG PO TABS
750.0000 mg | ORAL_TABLET | Freq: Once | ORAL | Status: AC
  Administered 2018-01-25: 750 mg via ORAL
  Filled 2018-01-25: qty 2

## 2018-01-25 MED ORDER — ACETAMINOPHEN 325 MG PO TABS
650.0000 mg | ORAL_TABLET | Freq: Once | ORAL | Status: AC
  Administered 2018-01-25: 650 mg via ORAL
  Filled 2018-01-25: qty 2

## 2018-01-25 MED ORDER — NAPROXEN 500 MG PO TABS: 500.0000 mg | ORAL_TABLET | Freq: Two times a day (BID) | ORAL | 0 refills | Status: DC

## 2018-01-25 MED FILL — NAPROXEN 500 MG TABLET: 500 | 15 days supply | Qty: 30 | Fill #0

## 2018-01-25 MED FILL — METHOCARBAMOL 500 MG TABLET: 500 | 10 days supply | Qty: 20 | Fill #0
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
## 2018-01-25 NOTE — ED Notes (Signed)
ED Provider at bedside. 
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
## 2018-01-25 NOTE — Discharge Instructions (Signed)
You were seen here today for Back Pain: Low back pain is discomfort in the lower back that may be due to injuries to muscles and ligaments around the spine. Occasionally, it may be caused by a problem to a part of the spine called a disc. Your back pain should be treated with medicines listed below as well as back exercises and this back pain should get better over the next 2 weeks. Most patients get completely well in 4 weeks. It is important to know however, if you develop severe or worsening pain, low back pain with fever, numbness, weakness or inability to walk or urinate, you should return to the ER immediately.  Please follow up with your doctor this week for a recheck if still having symptoms.  HOME INSTRUCTIONS Self - care:  The application of heat can help soothe the pain.  Maintaining your daily activities, including walking (this is encouraged), as it will help you get better faster than just staying in bed. Do not life, push, pull anything more than 10 pounds for the next week. I am attaching back exercises that you can do at home to help facilitate your recovery.   Back Exercises - I have attached a handout on back exercises that can be done at home to help facilitate your recovery.   Medications are also useful to help with pain control.   Acetaminophen.  This medication is generally safe, and found over the counter. Take as directed for your age. You should not take more than 8 of the extra strength (500mg ) pills a day (max dose is 4000mg  total OVER one day)  Non steroidal anti inflammatory: This includes medications including Ibuprofen, naproxen and Mobic; These medications help both pain and swelling and are very useful in treating back pain.  They should be taken with food, as they can cause stomach upset, and more seriously, stomach bleeding. Do not combine the medications.   Muscle relaxants:  These medications can help with muscle tightness that is a cause of lower back pain.  Most  of these medications can cause drowsiness, and it is not safe to drive or use dangerous machinery while taking them. They are primarily helpful when taken at night before sleep.   You will need to follow up with your primary healthcare provider  or the Orthopedist in 1-2 weeks for reassessment and persistent symptoms.  Be aware that if you develop new symptoms, such as a fever, leg weakness, difficulty with or loss of control of your urine or bowels, abdominal pain, or more severe pain, you will need to seek medical attention and/or return to the Emergency department.   Additional Information:  Your vital signs today were: BP 131/71    Pulse 61    Temp 98 F (36.7 C) (Oral)    Resp 18    Ht 5\' 4"  (1.626 m)    Wt 90.7 kg (200 lb)    LMP 01/18/2018    SpO2 100%    BMI 34.33 kg/m  If your blood pressure (BP) was elevated above 135/85 this visit, please have this repeated by your doctor within one month. ---------------

## 2018-01-25 NOTE — ED Notes (Signed)
NAD at this time. Pt is stable and going home.  

## 2018-02-05 ENCOUNTER — Ambulatory Visit (INDEPENDENT_AMBULATORY_CARE_PROVIDER_SITE_OTHER): Payer: Self-pay | Admitting: Orthopaedic Surgery

## 2018-02-11 ENCOUNTER — Ambulatory Visit (INDEPENDENT_AMBULATORY_CARE_PROVIDER_SITE_OTHER): Payer: Self-pay | Admitting: Orthopaedic Surgery

## 2018-02-11 ENCOUNTER — Ambulatory Visit (INDEPENDENT_AMBULATORY_CARE_PROVIDER_SITE_OTHER): Payer: Self-pay

## 2018-02-11 ENCOUNTER — Encounter (INDEPENDENT_AMBULATORY_CARE_PROVIDER_SITE_OTHER): Payer: Self-pay | Admitting: Orthopaedic Surgery

## 2018-02-11 DIAGNOSIS — M545 Low back pain: Secondary | ICD-10-CM

## 2018-02-11 DIAGNOSIS — G8929 Other chronic pain: Secondary | ICD-10-CM

## 2018-02-11 DIAGNOSIS — M898X1 Other specified disorders of bone, shoulder: Secondary | ICD-10-CM

## 2018-02-11 MED ORDER — METHOCARBAMOL 500 MG PO TABS: 500.0000 mg | ORAL_TABLET | Freq: Four times a day (QID) | ORAL | 2 refills | Status: DC | PRN

## 2018-02-11 MED ORDER — MELOXICAM 7.5 MG PO TABS: 7.5000 mg | ORAL_TABLET | Freq: Two times a day (BID) | ORAL | 2 refills | Status: DC | PRN

## 2018-02-11 NOTE — Progress Notes (Signed)
Office Visit Note   Patient: Frances Powell           Date of Birth: 1986/09/16           MRN: [id number] Visit Date: 02/11/2018              Requested by: No referring provider defined for this encounter. PCP: Patient, No Pcp Per   Assessment & Plan: Visit Diagnoses:  1. Chronic low back pain, unspecified back pain laterality, with sciatica presence unspecified   2. Pain of left clavicle     Plan: Impression is 32 year old female with low back pain and right periscapular trigger point.  Recommend formal physical therapy with modalities.  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  Prescription for Robaxin and meloxicam.  Questions encouraged and answered.  Follow-up as needed.  Follow-Up Instructions: Return if symptoms worsen or fail to improve.   Orders:  Orders Placed This Encounter  Procedures  . XR Lumbar Spine 2-3 Views  . XR Clavicle Left   Meds ordered this encounter  Medications  . methocarbamol (ROBAXIN) 500 MG tablet    Sig: Take 1 tablet (500 mg total) by mouth every 6 (six) hours as needed for muscle spasms.    Dispense:  30 tablet    Refill:  2  . meloxicam (MOBIC) 7.5 MG tablet    Sig: Take 1 tablet (7.5 mg total) by mouth 2 (two) times daily as needed for pain.    Dispense:  30 tablet    Refill:  2      Procedures: No procedures performed   Clinical Data: No additional findings.   Subjective: Chief Complaint  Patient presents with  . Lower Back - Pain  . Right Shoulder - Pain    COLLAR BONE    Frances Powell is a very pleasant 32 year old female comes in with low back pain and periscapular pain that is worse with lifting her clients.  She works as a Engineer, structural.  She takes naproxen and Robaxin as needed.  She has done home exercises but not done any formal physical therapy.  Denies any numbness and tingling or injuries.    Review of Systems  Constitutional: Negative.   HENT: Negative.   Eyes: Negative.   Respiratory: Negative.     Cardiovascular: Negative.   Endocrine: Negative.   Musculoskeletal: Negative.   Neurological: Negative.   Hematological: Negative.   Psychiatric/Behavioral: Negative.   All other systems reviewed and are negative.    Objective: Vital Signs: LMP 01/18/2018   Physical Exam  Constitutional: She is oriented to person, place, and time. She appears well-developed and well-nourished.  HENT:  Head: Normocephalic and atraumatic.  Eyes: EOM are normal.  Neck: Neck supple.  Pulmonary/Chest: Effort normal.  Abdominal: Soft.  Neurological: She is alert and oriented to person, place, and time.  Skin: Skin is warm. Capillary refill takes less than 2 seconds.  Psychiatric: She has a normal mood and affect. Her behavior is normal. Judgment and thought content normal.  Nursing note and vitals reviewed.   Ortho Exam Low back exam shows no palpable defects or step-offs.  Slightly limited range of motion secondary to discomfort.  No radicular signs.  No focal motor or sensory deficits. Right shoulder exam shows tenderness around the inferior corner of the scapula.  There is no overlying skin lesions.  There is no swelling.  She has full function of the shoulder. Specialty Comments:  No specialty comments available.  Imaging: Xr Clavicle Left  Result Date: 02/11/2018 No acute or structural abnormalities  Xr Lumbar Spine 2-3 Views  Result Date: 02/11/2018 No evidence of degenerative disc disease.  Questionable pars defect at L5-S1.  No spondylolisthesis    PMFS History: There are no active problems to display for this patient.  History reviewed. No pertinent past medical history.  History reviewed. No pertinent family history.  Past Surgical History:  Procedure Laterality Date  . APPENDECTOMY     Social History   Occupational History  . Not on file  Tobacco Use  . Smoking status: Current Every Day Smoker  . Smokeless tobacco: Never Used  Substance and Sexual Activity  .  Alcohol use: Yes    Comment: weekly  . Drug use: Yes    Types: Marijuana  . Sexual activity: Not on file
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
## 2019-07-28 ENCOUNTER — Telehealth: Payer: Self-pay | Admitting: General Practice

## 2019-07-28 NOTE — Telephone Encounter (Signed)
LMOVM for pt to call office back regarding establishing New Pt appt.

## 2019-09-05 ENCOUNTER — Ambulatory Visit: Payer: Self-pay | Admitting: Family Medicine

## 2019-09-24 ENCOUNTER — Encounter: Payer: Self-pay | Admitting: Family Medicine

## 2019-09-24 ENCOUNTER — Ambulatory Visit (INDEPENDENT_AMBULATORY_CARE_PROVIDER_SITE_OTHER): Payer: PRIVATE HEALTH INSURANCE | Admitting: Family Medicine

## 2019-09-24 ENCOUNTER — Other Ambulatory Visit: Payer: Self-pay

## 2019-09-24 VITALS — BP 137/81 | HR 71 | Temp 98.2°F | Resp 19 | Ht 65.5 in | Wt 231.5 lb

## 2019-09-24 DIAGNOSIS — E669 Obesity, unspecified: Secondary | ICD-10-CM

## 2019-09-24 DIAGNOSIS — F418 Other specified anxiety disorders: Secondary | ICD-10-CM

## 2019-09-24 DIAGNOSIS — Z0000 Encounter for general adult medical examination without abnormal findings: Secondary | ICD-10-CM | POA: Insufficient documentation

## 2019-09-24 MED ORDER — VENLAFAXINE HCL ER 37.5 MG PO CP24: ORAL_CAPSULE | ORAL | 0 refills | Status: DC

## 2019-09-24 NOTE — Patient Instructions (Addendum)
It was a pleasure to meet you today.   Major Depressive Disorder, Adult Major depressive disorder (MDD) is a mental health condition. MDD often makes you feel sad, hopeless, or helpless. MDD can also cause symptoms in your body. MDD can affect your:  Work.  School.  Relationships.  Other normal activities. MDD can range from mild to very bad. It may occur once (single episode MDD). It can also occur many times (recurrent MDD). The main symptoms of MDD often include:  Feeling sad, depressed, or irritable most of the time.  Loss of interest. MDD symptoms also include:  Sleeping too much or too little.  Eating too much or too little.  A change in your weight.  Feeling tired (fatigue) or having low energy.  Feeling worthless.  Feeling guilty.  Trouble making decisions.  Trouble thinking clearly.  Thoughts of suicide or harming others.  Feeling weak.  Feeling agitated.  Keeping yourself from being around other people (isolation). Follow these instructions at home: Activity  Do these things as told by your doctor: ? Go back to your normal activities. ? Exercise regularly. ? Spend time outdoors. Alcohol  Talk with your doctor about how alcohol can affect your antidepressant medicines.  Do not drink alcohol. Or, limit how much alcohol you drink. ? This means no more than 1 drink a day for nonpregnant women and 2 drinks a day for men. One drink equals one of these:  12 oz of beer.  5 oz of wine.  1 oz of hard liquor. General instructions  Take over-the-counter and prescription medicines only as told by your doctor.  Eat a healthy diet.  Get plenty of sleep.  Find activities that you enjoy. Make time to do them.  Think about joining a support group. Your doctor may be able to suggest a group for you.  Keep all follow-up visits as told by your doctor. This is important. Where to find more information:  [employer] on Mental  Illness: ? www.nami.org  [place] [employer] of Mental Health: ? http://www.maynard.net/  National Suicide Prevention Lifeline: ? (787) 127-7375. This is free, 24-hour help. Contact a doctor if:  Your symptoms get worse.  You have new symptoms. Get help right away if:  You self-harm.  You see, hear, taste, smell, or feel things that are not present (hallucinate). If you ever feel like you may hurt yourself or others, or have thoughts about taking your own life, get help right away. You can go to your nearest emergency department or call:  Your local emergency services (911 in the [place]  A suicide crisis helpline, such as the National Suicide Prevention Lifeline: ? (639) 494-7570. This is open 24 hours a day. This information is not intended to replace advice given to you by your health care provider. Make sure you discuss any questions you have with your health care provider. Document Released: 11/01/2015 Document Revised: 11/02/2017 Document Reviewed: 08/06/2016 Elsevier Patient Education  2020 [employer].    Please help [place] help you:  We are honored you have chosen [facility] for your Primary Care home. Below you will find basic instructions that you may need to access in the future. Please help [place] help you by reading the instructions, which cover many of the frequent questions we experience.   Prescription refills and request:  -In order to allow more efficient response time, please call your pharmacy for all refills. They will forward the request electronically to [place]. This allows for the quickest possible  response. Request left on a nurse line can take longer to refill, since these are checked as time allows between office patients and other phone calls.  - refill request can take up to 3-5 working days to complete.  - If request is sent electronically and request is appropiate, it is usually completed in 1-2 business days.  - all patients will need to be seen  routinely for all chronic medical conditions requiring prescription medications (see follow-up below). If you are overdue for follow up on your condition, you will be asked to make an appointment and we will call in enough medication to cover you until your appointment (up to 30 days).  - all controlled substances will require a face to face visit to request/refill.  - if you desire your prescriptions to go through a new pharmacy, and have an active script at original pharmacy, you will need to call your pharmacy and have scripts transferred to new pharmacy. This is completed between the pharmacy locations and not by your provider.    Results: If any images or labs were ordered, it can take up to 1 week to get results depending on the test ordered and the lab/facility running and resulting the test. - Normal or stable results, which do not need further discussion, may be released to your mychart immediately with attached note to you. A call may not be generated for normal results. Please make certain to sign up for mychart. If you have questions on how to activate your mychart you can call the front office.  - If your results need further discussion, our office will attempt to contact you via phone, and if unable to reach you after 2 attempts, we will release your abnormal result to your mychart with instructions.  - All results will be automatically released in mychart after 1 week.  - Your provider will provide you with explanation and instruction on all relevant material in your results. Please keep in mind, results and labs may appear confusing or abnormal to the untrained eye, but it does not mean they are actually abnormal for you personally. If you have any questions about your results that are not covered, or you desire more detailed explanation than what was provided, you should make an appointment with your provider to do so.   Our office handles many outgoing and incoming calls daily. If we  have not contacted you within 1 week about your results, please check your mychart to see if there is a message first and if not, then contact our office.  In helping with this matter, you help decrease call volume, and therefore allow [place] to be able to respond to patients needs more efficiently.   Acute office visits (sick visit):  An acute visit is intended for a new problem and are scheduled in shorter time slots to allow schedule openings for patients with new problems. This is the appropriate visit to discuss a new problem. Problems will not be addressed by phone call or Echart message. Appointment is needed if requesting treatment. In order to provide you with excellent quality medical care with proper time for you to explain your problem, have an exam and receive treatment with instructions, these appointments should be limited to one new problem per visit. If you experience a new problem, in which you desire to be addressed, please make an acute office visit, we save openings on the schedule to accommodate you. Please do not save your new problem for any other type  of visit, let us take care of it properly and quickly for you.   Follow up visits:  Depending on your condition(s) your provider will need to see you routinely in order to provide you with quality care and prescribe medication(s). Most chronic conditions (Example: hypertension, Diabetes, depression/anxiety... etc), require visits a couple times a year. Your provider will instruct you on proper follow up for your personal medical conditions and history. Please make certain to make follow up appointments for your condition as instructed. Failing to do so could result in lapse in your medication treatment/refills. If you request a refill, and are overdue to be seen on a condition, we will always provide you with a 30 day script (once) to allow you time to schedule.    Medicare wellness (well visit): - we have a wonderful Nurse Selena Batten(Kim), that  will meet with you and provide you will yearly medicare wellness visits. These visits should occur yearly (can not be scheduled less than 1 calendar year apart) and cover preventive health, immunizations, advance directives and screenings you are entitled to yearly through your medicare benefits. Do not miss out on your entitled benefits, this is when medicare will pay for these benefits to be ordered for you.  These are strongly encouraged by your provider and is the appropriate type of visit to make certain you are up to date with all preventive health benefits. If you have not had your medicare wellness exam in the last 12 months, please make certain to schedule one by calling the office and schedule your medicare wellness with Selena BattenKim as soon as possible.   Yearly physical (well visit):  - Adults are recommended to be seen yearly for physicals. Check with your insurance and date of your last physical, most insurances require one calendar year between physicals. Physicals include all preventive health topics, screenings, medical exam and labs that are appropriate for gender/age and history. You may have fasting labs needed at this visit. This is a well visit (not a sick visit), new problems should not be covered during this visit (see acute visit).  - Pediatric patients are seen more frequently when they are younger. Your provider will advise you on well child visit timing that is appropriate for your their age. - This is not a medicare wellness visit. Medicare wellness exams do not have an exam portion to the visit. Some medicare companies allow for a physical, some do not allow a yearly physical. If your medicare allows a yearly physical you can schedule the medicare wellness with our nurse Selena BattenKim and have your physical with your provider after, on the same day. Please check with insurance for your full benefits.   Late Policy/No Shows:  - all new patients should arrive 15-30 minutes earlier than appointment  to allow us time  to  obtain all personal demographics,  insurance information and for you to complete office paperwork. - All established patients should arrive 10-15 minutes earlier than appointment time to update all information and be checked in .  - In our best efforts to run on time, if you are late for your appointment you will be asked to either reschedule or if able, we will work you back into the schedule. There will be a wait time to work you back in the schedule,  depending on availability.  - If you are unable to make it to your appointment as scheduled, please call 24 hours ahead of time to allow us to fill the time slot with  someone else who needs to be seen. If you do not cancel your appointment ahead of time, you may be charged a no show fee.

## 2019-09-24 NOTE — Progress Notes (Signed)
Patient ID: Frances Powell, female  DOB: [birth year], 33 y.o.   MRN: [id number] Patient Care Team    Relationship Specialty Notifications Start End  Natalia Leatherwood, DO PCP - General Family Medicine  09/24/19   Tarry Kos, MD Attending Physician Orthopedic Surgery  09/24/19     Chief Complaint  Patient presents with   Establish Care    Pt is having some issues with depression.  Would like to discuss medication.     Subjective:  Frances Powell is a 33 y.o.  female present for new patient establishment. All past medical history, surgical history, allergies, family history, immunizations, medications and social history were updated in the electronic medical record today. All recent labs, ED visits and hospitalizations within the last year were reviewed.  Depression/anxiety: Patient presents today for new patient establishment with complaints of increased depression and anxiety.  She states she is not been on medications for depression or anxiety in the past.  He states she is no she has had depression and anxiety symptoms for quite a few years, but she is not "big on taking medicine."Therefore she has tried to deal with it on her own.  She states she is always been a little "high strung "in general.  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  Since this time she has struggled with motivation.  Her anxiety has increased.  She would rather stay in bed the majority of the day.  She states this is new for her because she usually is always wanting to be on the go.  She reports her wife is very supportive.  She has a mother and siblings in [place].  And she has a younger brother that lives with her and her wife.  She denies SI or HI.  Depression screen PHQ 2/9 09/24/2019  Decreased Interest 3  Down, Depressed, Hopeless 2  PHQ - 2 Score 5  Altered sleeping 3  Tired, decreased energy 3  Change in appetite 1  Feeling bad or failure about  yourself  3  Trouble concentrating 1  Moving slowly or fidgety/restless 2  Suicidal thoughts 0  PHQ-9 Score 18  Difficult doing work/chores Very difficult   GAD 7 : Generalized Anxiety Score 09/24/2019  Nervous, Anxious, on Edge 3  Control/stop worrying 3  Worry too much - different things 3  Trouble relaxing 3  Restless 2  Easily annoyed or irritable 3  Afraid - awful might happen 3  Total GAD 7 Score 20       No flowsheet data found.   There is no immunization history on file for this patient.  No exam data present  Past Medical History:  Diagnosis Date   Alcohol abuse    Depression    Migraines    No Known Allergies Past Surgical History:  Procedure Laterality Date   APPENDECTOMY  2010   Family History  Problem Relation Age of Onset   Arthritis Mother    Depression Mother    Lupus Mother    Hypertension Mother    Kidney disease Mother    Miscarriages / [place] Mother    Leukemia Father    Early death Father    Kidney disease Maternal Grandmother    Cancer Maternal Grandfather    Heart attack Paternal Grandfather    Early death Sister    Heart disease Sister    Cervical cancer Sister    Social History   Social  History Narrative   Marital status/children/pets: married   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    Safety:      -smoke alarm in the home:Yes     - wears seatbelt: Yes     - Feels safe in their relationships: Yes    All past medical history, surgical history, allergies, family history, immunizations andmedications were updated in the EMR today and reviewed under the history and medication portions of their EMR.    No results found for this or any previous visit (from the past 2160 hour(s)).  No results found.   ROS: 14 pt review of systems performed and negative (unless mentioned in an HPI)  Objective: BP 137/81 (BP Location: Left Arm, Patient Position: Sitting, Cuff Size: Normal)    Pulse 71     Temp 98.2 F (36.8 C) (Temporal)    Resp 19    Ht 5' 5.5" (1.664 m)    Wt 231 lb 8 oz (105 kg)    LMP 08/14/2019 (Approximate)    SpO2 97%    BMI 37.94 kg/m  Gen: Afebrile. No acute distress. Nontoxic in appearance, well-developed, well-nourished, pleasant, obese Caucasian female. HENT: AT. [place].  Eyes:Pupils Equal Round Reactive to light, Extraocular movements intact,  Conjunctiva without redness, discharge or icterus. CV: RRR no murmur, no edema Chest: CTAB, no wheeze, rhonchi or crackles.  Skin: Warm and well-perfused. Skin intact. Neuro/Msk: Normal gait. PERLA. EOMi. Alert. Oriented x3.  Psych: Normal affect, dress and demeanor. Normal speech. Normal thought content and judgment.   Assessment/plan: Frances Powell is a 33 y.o. female present for establish care.  Depression with anxiety Lengthy discussion with patient today on possible approaches to her worsening depression and anxiety.  Patient elected to start Effexor taper.  She was advised medication needs to be taken daily around the same time every day.  She reports understanding.  She was advised never to quit this medication abruptly she reports understanding. Effexor 37.5 mg daily 7 days, then increase to 75 mg daily until follow-up in 8 weeks which can be her physical with Pap. Patient also voiced desire to speak with a psychologist, referral placed today to Dr. Gaynell Face. - Ambulatory referral to Psychology Follow-up 8 weeks  Obesity (BMI 30-39.9) Increasing exercise encouraged both to help with obesity and anxiety.   Return in about 8 weeks (around 11/19/2019) for CPE (30 min).  With Pap   Note is dictated utilizing voice recognition software. Although note has been proof read prior to signing, occasional typographical errors still can be missed. If any questions arise, please do not hesitate to call for verification.  Electronically signed by: Howard Pouch, DO [place]

## 2019-09-25 DIAGNOSIS — E669 Obesity, unspecified: Secondary | ICD-10-CM | POA: Insufficient documentation

## 2019-10-27 ENCOUNTER — Ambulatory Visit: Payer: 59 | Admitting: Professional

## 2019-11-05 ENCOUNTER — Encounter: Payer: Self-pay | Admitting: Family Medicine

## 2019-11-05 ENCOUNTER — Inpatient Hospital Stay
Admission: RE | Admit: 2019-11-05 | Discharge: 2019-11-05 | Disposition: A | Discharge status: Home / Self Care | Payer: PRIVATE HEALTH INSURANCE | Source: Ambulatory Visit

## 2019-11-05 ENCOUNTER — Encounter (HOSPITAL_COMMUNITY): Payer: Self-pay

## 2019-11-05 NOTE — Telephone Encounter (Signed)
Pt was called and said reading on wrist cuff was --160/108, 161/108, and manually 160/115 at the dentist one hour ago. Pt does have frequent headaches but does not right now. She was nervous for dental procedure. She does now need medical clearance to have dental work done. 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 She is unable to come to appts until after 2pm when she gets off work. She asked for Friday afternoon and was scheduled. Pt was told to make sure she is hydrating, watching sodium intake, and having nurse at work check BP if having headache, dizziness, not feeling well, and if high again to call us, if any chest pain call 911, she verbalized understanding.

## 2019-11-07 ENCOUNTER — Ambulatory Visit (INDEPENDENT_AMBULATORY_CARE_PROVIDER_SITE_OTHER): Payer: PRIVATE HEALTH INSURANCE | Admitting: Family Medicine

## 2019-11-07 ENCOUNTER — Encounter: Payer: Self-pay | Admitting: Family Medicine

## 2019-11-07 ENCOUNTER — Other Ambulatory Visit: Payer: Self-pay

## 2019-11-07 VITALS — BP 142/84 | HR 79 | Temp 98.3°F | Resp 16 | Ht 66.0 in | Wt 229.0 lb

## 2019-11-07 DIAGNOSIS — F330 Major depressive disorder, recurrent, mild: Secondary | ICD-10-CM | POA: Insufficient documentation

## 2019-11-07 DIAGNOSIS — E669 Obesity, unspecified: Secondary | ICD-10-CM | POA: Diagnosis not present

## 2019-11-07 DIAGNOSIS — F418 Other specified anxiety disorders: Secondary | ICD-10-CM | POA: Insufficient documentation

## 2019-11-07 DIAGNOSIS — F17200 Nicotine dependence, unspecified, uncomplicated: Secondary | ICD-10-CM | POA: Insufficient documentation

## 2019-11-07 DIAGNOSIS — I10 Essential (primary) hypertension: Secondary | ICD-10-CM | POA: Diagnosis not present

## 2019-11-07 DIAGNOSIS — R002 Palpitations: Secondary | ICD-10-CM | POA: Diagnosis not present

## 2019-11-07 MED ORDER — METOPROLOL TARTRATE 25 MG PO TABS: 25.0000 mg | ORAL_TABLET | Freq: Two times a day (BID) | ORAL | 0 refills | Status: DC

## 2019-11-07 NOTE — Patient Instructions (Addendum)
Start metoprolol every 12 hours.  Try to quit smoking. Definitely avoid smoking prior to dental appt. It will raise your BP as will caffeine.    Low-Sodium Eating Plan Sodium, which is an element that makes up salt, helps you maintain a healthy balance of fluids in your body. Too much sodium can increase your blood pressure and cause fluid and waste to be held in your body. Your health care provider or dietitian may recommend following this plan if you have high blood pressure (hypertension), kidney disease, liver disease, or heart failure. Eating less sodium can help lower your blood pressure, reduce swelling, and protect your heart, liver, and kidneys. What are tips for following this plan? General guidelines  Most people on this plan should limit their sodium intake to 1,500-2,000 mg (milligrams) of sodium each day. Reading food labels   The Nutrition Facts label lists the amount of sodium in one serving of the food. If you eat more than one serving, you must multiply the listed amount of sodium by the number of servings.  Choose foods with less than 140 mg of sodium per serving.  Avoid foods with 300 mg of sodium or more per serving. Shopping  Look for lower-sodium products, often labeled as "low-sodium" or "no salt added."  Always check the sodium content even if foods are labeled as "unsalted" or "no salt added".  Buy fresh foods. ? Avoid canned foods and premade or frozen meals. ? Avoid canned, cured, or processed meats  Buy breads that have less than 80 mg of sodium per slice. Cooking  Eat more home-cooked food and less restaurant, buffet, and fast food.  Avoid adding salt when cooking. Use salt-free seasonings or herbs instead of table salt or sea salt. Check with your health care provider or pharmacist before using salt substitutes.  Cook with plant-based oils, such as canola, sunflower, or olive oil. Meal planning  When eating at a restaurant, ask that your food be  prepared with less salt or no salt, if possible.  Avoid foods that contain MSG (monosodium glutamate). MSG is sometimes added to [place] food, bouillon, and some canned foods. What foods are recommended? The items listed may not be a complete list. Talk with your dietitian about what dietary choices are best for you. Grains Low-sodium cereals, including oats, puffed wheat and rice, and shredded wheat. Low-sodium crackers. Unsalted rice. Unsalted pasta. Low-sodium bread. Whole-grain breads and whole-grain pasta. Vegetables Fresh or frozen vegetables. "No salt added" canned vegetables. "No salt added" tomato sauce and paste. Low-sodium or reduced-sodium tomato and vegetable juice. Fruits Fresh, frozen, or canned fruit. Fruit juice. Meats and other protein foods Fresh or frozen (no salt added) meat, poultry, seafood, and fish. Low-sodium canned tuna and salmon. Unsalted nuts. Dried peas, beans, and lentils without added salt. Unsalted canned beans. Eggs. Unsalted nut butters. Dairy Milk. Soy milk. Cheese that is naturally low in sodium, such as ricotta cheese, fresh mozzarella, or Swiss cheese Low-sodium or reduced-sodium cheese. Cream cheese. Yogurt. Fats and oils Unsalted butter. Unsalted margarine with no trans fat. Vegetable oils such as canola or olive oils. Seasonings and other foods Fresh and dried herbs and spices. Salt-free seasonings. Low-sodium mustard and ketchup. Sodium-free salad dressing. Sodium-free light mayonnaise. Fresh or refrigerated horseradish. Lemon juice. Vinegar. Homemade, reduced-sodium, or low-sodium soups. Unsalted popcorn and pretzels. Low-salt or salt-free chips. What foods are not recommended? The items listed may not be a complete list. Talk with your dietitian about what dietary choices are best for  you. Grains Instant hot cereals. Bread stuffing, pancake, and biscuit mixes. Croutons. Seasoned rice or pasta mixes. Noodle soup cups. Boxed or frozen macaroni and  cheese. Regular salted crackers. Self-rising flour. Vegetables Sauerkraut, pickled vegetables, and relishes. Olives. [place] fries. Onion rings. Regular canned vegetables (not low-sodium or reduced-sodium). Regular canned tomato sauce and paste (not low-sodium or reduced-sodium). Regular tomato and vegetable juice (not low-sodium or reduced-sodium). Frozen vegetables in sauces. Meats and other protein foods Meat or fish that is salted, canned, smoked, spiced, or pickled. Bacon, ham, sausage, hotdogs, corned beef, chipped beef, packaged lunch meats, salt pork, jerky, pickled herring, anchovies, regular canned tuna, sardines, salted nuts. Dairy Processed cheese and cheese spreads. Cheese curds. Blue cheese. Feta cheese. String cheese. Regular cottage cheese. Buttermilk. Canned milk. Fats and oils Salted butter. Regular margarine. Ghee. Bacon fat. Seasonings and other foods Onion salt, garlic salt, seasoned salt, table salt, and sea salt. Canned and packaged gravies. Worcestershire sauce. Tartar sauce. Barbecue sauce. Teriyaki sauce. Soy sauce, including reduced-sodium. Steak sauce. Fish sauce. Oyster sauce. Cocktail sauce. Horseradish that you find on the shelf. Regular ketchup and mustard. Meat flavorings and tenderizers. Bouillon cubes. Hot sauce and Tabasco sauce. Premade or packaged marinades. Premade or packaged taco seasonings. Relishes. Regular salad dressings. Salsa. Potato and tortilla chips. Corn chips and puffs. Salted popcorn and pretzels. Canned or dried soups. Pizza. Frozen entrees and pot pies. Summary  Eating less sodium can help lower your blood pressure, reduce swelling, and protect your heart, liver, and kidneys.  Most people on this plan should limit their sodium intake to 1,500-2,000 mg (milligrams) of sodium each day.  Canned, boxed, and frozen foods are high in sodium. Restaurant foods, fast foods, and pizza are also very high in sodium. You also get sodium by adding salt to  food.  Try to cook at home, eat more fresh fruits and vegetables, and eat less fast food, canned, processed, or prepared foods. This information is not intended to replace advice given to you by your health care provider. Make sure you discuss any questions you have with your health care provider. Document Released: 05/12/2002 Document Revised: 11/02/2017 Document Reviewed: 11/13/2016 Elsevier Patient Education  2020 [employer].

## 2019-11-07 NOTE — Progress Notes (Signed)
This visit occurred during the SARS-CoV-2 public health emergency.  Safety protocols were in place, including screening questions prior to the visit, additional usage of staff PPE, and extensive cleaning of exam room while observing appropriate contact time as indicated for disinfecting solutions.    Frances Powell , 1986-09-30, 33 y.o., female MRN: [id number] Patient Care Team    Relationship Specialty Notifications Start End  Ma Hillock, DO PCP - General Family Medicine  09/24/19   Leandrew Koyanagi, MD Attending Physician Orthopedic Surgery  09/24/19     Chief Complaint  Patient presents with  . Hypertension    Pt went to the dentist 11/05/2019 and they used a wrist cuff and got high readings as well as one manual reading. She has dental clearance paperwork to get dental work done that needs a signature. 168/111 Thurs am, Thurs pm 133/92, Friday AM 171/91      Subjective: Pt presents for an OV with complaints of elevated blood pressures her dentist office.  Patient established here approximately 6 weeks ago with a borderline blood pressure at that time, but still in normal range.  She is a smoker.  She does endorse smoking a cigarette before going into her dental office.  She also drinks a fair amount of caffeine.  She has a wrist cuff at home and had been checking her blood pressures after her dental office and the lowest that she saw was 133/92 and the highest that she is had was 528/413 she is uncertain if her wrist cuff works well.  She denies any chest pain or shortness of breath.  She does endorse having occasional headaches and dizziness. Patient denies chest pain, shortness of breath or lower extremity edema. BMP: Collected today 11/07/2019 CBC: Collected today 11/07/2019 TSH: Collected today 11/07/2019 Diet: Strongly encourage low-sodium diet Exercise: Encouraged routine exercise RF: Hypertension, obesity, everyday smoker, family history of heart disease  Depression  screen PHQ 2/9 09/24/2019  Decreased Interest 3  Down, Depressed, Hopeless 2  PHQ - 2 Score 5  Altered sleeping 3  Tired, decreased energy 3  Change in appetite 1  Feeling bad or failure about yourself  3  Trouble concentrating 1  Moving slowly or fidgety/restless 2  Suicidal thoughts 0  PHQ-9 Score 18  Difficult doing work/chores Very difficult    No Known Allergies Social History   Social History Narrative   Marital status/children/pets: married   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    Safety:      -smoke alarm in the home:Yes     - wears seatbelt: Yes     - Feels safe in their relationships: Yes   Past Medical History:  Diagnosis Date  . Alcohol abuse   . Depression   . Migraines    Past Surgical History:  Procedure Laterality Date  . APPENDECTOMY  2010   Family History  Problem Relation Age of Onset  . Arthritis Mother   . Depression Mother   . Lupus Mother   . Hypertension Mother   . Kidney disease Mother   . Miscarriages / [place] Mother   . Leukemia Father   . Early death Father   . Kidney disease Maternal Grandmother   . Cancer Maternal Grandfather   . Heart attack Paternal Grandfather   . Early death Sister   . Heart disease Sister   . Cervical cancer Sister    Allergies as of 11/07/2019   No Known Allergies     Medication  List       Accurate as of November 07, 2019  3:33 PM. If you have any questions, ask your nurse or doctor.        venlafaxine XR 37.5 MG 24 hr capsule Commonly known as: Effexor XR Take 1 capsule (37.5 mg total) by mouth daily with breakfast for 7 days, THEN 2 capsules (75 mg total) daily with breakfast. Start taking on: September 24, 2019       All past medical history, surgical history, allergies, family history, immunizations andmedications were updated in the EMR today and reviewed under the history and medication portions of their EMR.     ROS: Negative, with the exception of above mentioned  in HPI   Objective:  BP (!) 142/84 (BP Location: Left Arm, Patient Position: Sitting, Cuff Size: Normal)   Pulse 79   Temp 98.3 F (36.8 C) (Temporal)   Resp 16   Ht 5' 6"  (1.676 m)   Wt 229 lb (103.9 kg)   LMP 10/23/2019 (Exact Date)   SpO2 99%   BMI 36.96 kg/m  Body mass index is 36.96 kg/m. Gen: Afebrile. No acute distress. Nontoxic in appearance, well developed, well nourished.  Very pleasant obese Caucasian female. HENT: AT. [place].  Eyes:Pupils Equal Round Reactive to light, Extraocular movements intact,  Conjunctiva without redness, discharge or icterus. Neck/lymp/endocrine: Supple, no lymphadenopathy, no thyromegaly CV: RRR no murmur, no edema Chest: CTAB, no wheeze or crackles. Good air movement, normal resp effort.  Neuro: Normal gait. PERLA. EOMi. Alert. Oriented x3  Psych: Normal affect, dress and demeanor. Normal speech. Normal thought content and judgment.  No exam data present No results found. No results found for this or any previous visit (from the past 24 hour(s)).  Assessment/Plan: Frances Powell is a 33 y.o. female present for OV for  Essential hypertension/obesity/palpitations -Discussed options with her today and I do believe some of her blood pressure readings are secondary to anxiety and stimulant use.  She does endorse smoking prior to going into her dental appointment which could be part of the cause of her elevated pressure.  She also drinks quite a great deal of caffeine. -Start metoprolol 25 mg twice daily.  She has a follow-up scheduled for her physical in 2 weeks, will reevaluate her at that time. -Advised her to quit smoking and cut back on her caffeine use. -Hydrate, increase water intake. -Discussed low-sodium diet with her today and AVS instruction was provided to her on a low sodium. - Comp Met (CMET) - CBC w/Diff - TSH -She has follow-up scheduled in 2 weeks.  Tobacco use disorder Strongly encouraged her to quit smoking. Advised her to  avoid smoking cigarettes or drinking any caffeine prior to her doctor or dental appointments to have a more accurate reading of her blood pressure.    Reviewed expectations re: course of current medical issues.  Discussed self-management of symptoms.  Outlined signs and symptoms indicating need for more acute intervention.  Patient verbalized understanding and all questions were answered.  Patient received an After-Visit Summary.    No orders of the defined types were placed in this encounter.    Note is dictated utilizing voice recognition software. Although note has been proof read prior to signing, occasional typographical errors still can be missed. If any questions arise, please do not hesitate to call for verification.   electronically signed by:  Howard Pouch, DO  [place]

## 2019-11-08 LAB — COMPREHENSIVE METABOLIC PANEL
AG Ratio: 1.5 (calc) (ref 1.0–2.5)
ALT: 26 U/L (ref 6–29)
AST: 25 U/L (ref 10–30)
Albumin: 4.3 g/dL (ref 3.6–5.1)
Alkaline phosphatase (APISO): 67 U/L (ref 31–125)
BUN/Creatinine Ratio: 12 (calc) (ref 6–22)
BUN: 15 mg/dL (ref 7–25)
CO2: 22 mmol/L (ref 20–32)
Calcium: 9.8 mg/dL (ref 8.6–10.2)
Chloride: 105 mmol/L (ref 98–110)
Creat: 1.21 mg/dL — ABNORMAL HIGH (ref 0.50–1.10)
Globulin: 2.8 g/dL (calc) (ref 1.9–3.7)
Glucose, Bld: 87 mg/dL (ref 65–99)
Potassium: 3.7 mmol/L (ref 3.5–5.3)
Sodium: 140 mmol/L (ref 135–146)
Total Bilirubin: 0.3 mg/dL (ref 0.2–1.2)
Total Protein: 7.1 g/dL (ref 6.1–8.1)

## 2019-11-08 LAB — CBC WITH DIFFERENTIAL/PLATELET
Absolute Monocytes: 530 cells/uL (ref 200–950)
Basophils Absolute: 20 cells/uL (ref 0–200)
Basophils Relative: 0.2 %
Eosinophils Absolute: 40 cells/uL (ref 15–500)
Eosinophils Relative: 0.4 %
HCT: 35.7 % (ref 35.0–45.0)
Hemoglobin: 12.5 g/dL (ref 11.7–15.5)
Lymphs Abs: 2580 cells/uL (ref 850–3900)
MCH: 34.2 pg — ABNORMAL HIGH (ref 27.0–33.0)
MCHC: 35 g/dL (ref 32.0–36.0)
MCV: 97.5 fL (ref 80.0–100.0)
MPV: 10.2 fL (ref 7.5–12.5)
Monocytes Relative: 5.3 %
Neutro Abs: 6830 cells/uL (ref 1500–7800)
Neutrophils Relative %: 68.3 %
Platelets: 355 10*3/uL (ref 140–400)
RBC: 3.66 10*6/uL — ABNORMAL LOW (ref 3.80–5.10)
RDW: 13.1 % (ref 11.0–15.0)
Total Lymphocyte: 25.8 %
WBC: 10 10*3/uL (ref 3.8–10.8)

## 2019-11-08 LAB — TSH: TSH: 1.28 mIU/L

## 2019-11-10 ENCOUNTER — Telehealth: Payer: Self-pay | Admitting: Family Medicine

## 2019-11-10 NOTE — Telephone Encounter (Signed)
Returning call regarding her lab results  Please call [phone]  Thank you

## 2019-11-10 NOTE — Telephone Encounter (Signed)
Please inform patient the following information: Her blood cell counts are normal.  Her liver function is normal. Her kidney function does show a very mild decrease, and this is likely secondary to her elevated blood pressure.  Would encourage her to take plenty of water, at least 80-100 ounces of water a day.  And take the new medication that was started called metoprolol every 12 hours for blood pressure control.  We will continue to monitor her kidney function routinely with her hypertension follow-ups.

## 2019-11-10 NOTE — Telephone Encounter (Signed)
Pt was given lab results- See phone previous phone note

## 2019-11-10 NOTE — Telephone Encounter (Signed)
Pt returned call and was given lab results/instructions, she verbalized understanding

## 2019-11-10 NOTE — Telephone Encounter (Signed)
LMOM for patient to CB to discuss results / recommendations.

## 2019-11-19 ENCOUNTER — Encounter: Payer: PRIVATE HEALTH INSURANCE | Admitting: Family Medicine

## 2019-11-25 ENCOUNTER — Other Ambulatory Visit (HOSPITAL_COMMUNITY)
Admission: RE | Admit: 2019-11-25 | Discharge: 2019-11-25 | Disposition: A | Discharge status: Home / Self Care | Payer: PRIVATE HEALTH INSURANCE | Source: Ambulatory Visit | Attending: Family Medicine | Admitting: Family Medicine

## 2019-11-25 ENCOUNTER — Ambulatory Visit (INDEPENDENT_AMBULATORY_CARE_PROVIDER_SITE_OTHER): Payer: PRIVATE HEALTH INSURANCE | Admitting: Family Medicine

## 2019-11-25 ENCOUNTER — Encounter: Payer: Self-pay | Admitting: Family Medicine

## 2019-11-25 ENCOUNTER — Other Ambulatory Visit: Payer: Self-pay

## 2019-11-25 VITALS — BP 138/85 | HR 61 | Temp 98.4°F | Resp 17 | Ht 65.5 in | Wt 231.2 lb

## 2019-11-25 DIAGNOSIS — Z124 Encounter for screening for malignant neoplasm of cervix: Secondary | ICD-10-CM | POA: Diagnosis not present

## 2019-11-25 DIAGNOSIS — I10 Essential (primary) hypertension: Secondary | ICD-10-CM | POA: Diagnosis not present

## 2019-11-25 DIAGNOSIS — F418 Other specified anxiety disorders: Secondary | ICD-10-CM

## 2019-11-25 DIAGNOSIS — Z0000 Encounter for general adult medical examination without abnormal findings: Secondary | ICD-10-CM

## 2019-11-25 DIAGNOSIS — R002 Palpitations: Secondary | ICD-10-CM | POA: Diagnosis not present

## 2019-11-25 DIAGNOSIS — Z23 Encounter for immunization: Secondary | ICD-10-CM

## 2019-11-25 DIAGNOSIS — Z131 Encounter for screening for diabetes mellitus: Secondary | ICD-10-CM

## 2019-11-25 DIAGNOSIS — E669 Obesity, unspecified: Secondary | ICD-10-CM

## 2019-11-25 MED ORDER — METOPROLOL SUCCINATE ER 25 MG PO TB24: 25.0000 mg | ORAL_TABLET | Freq: Every day | ORAL | 1 refills | Status: DC

## 2019-11-25 MED ORDER — LISINOPRIL 5 MG PO TABS: 5.0000 mg | ORAL_TABLET | Freq: Every day | ORAL | 0 refills | Status: DC

## 2019-11-25 MED ORDER — VENLAFAXINE HCL ER 75 MG PO CP24: 75.0000 mg | ORAL_CAPSULE | Freq: Every day | ORAL | 1 refills | Status: DC

## 2019-11-25 NOTE — Patient Instructions (Signed)
Health Maintenance, Female Adopting a healthy lifestyle and getting preventive care are important in promoting health and wellness. Ask your health care provider about:  The right schedule for you to have regular tests and exams.  Things you can do on your own to prevent diseases and keep yourself healthy. What should I know about diet, weight, and exercise? Eat a healthy diet   Eat a diet that includes plenty of vegetables, fruits, low-fat dairy products, and lean protein.  Do not eat a lot of foods that are high in solid fats, added sugars, or sodium. Maintain a healthy weight Body mass index (BMI) is used to identify weight problems. It estimates body fat based on height and weight. Your health care provider can help determine your BMI and help you achieve or maintain a healthy weight. Get regular exercise Get regular exercise. This is one of the most important things you can do for your health. Most adults should:  Exercise for at least 150 minutes each week. The exercise should increase your heart rate and make you sweat (moderate-intensity exercise).  Do strengthening exercises at least twice a week. This is in addition to the moderate-intensity exercise.  Spend less time sitting. Even light physical activity can be beneficial. Watch cholesterol and blood lipids Have your blood tested for lipids and cholesterol at 31 years of age, then have this test every 5 years. Have your cholesterol levels checked more often if:  Your lipid or cholesterol levels are high.  You are older than 31 years of age.  You are at high risk for heart disease. What should I know about cancer screening? Depending on your health history and family history, you may need to have cancer screening at various ages. This may include screening for:  Breast cancer.  Cervical cancer.  Colorectal cancer.  Skin cancer.  Lung cancer. What should I know about heart disease, diabetes, and high blood  pressure? Blood pressure and heart disease  High blood pressure causes heart disease and increases the risk of stroke. This is more likely to develop in people who have high blood pressure readings, are of African descent, or are overweight.  Have your blood pressure checked: ? Every 3-5 years if you are 18-39 years of age. ? Every year if you are 40 years old or older. Diabetes Have regular diabetes screenings. This checks your fasting blood sugar level. Have the screening done:  Once every three years after age 40 if you are at a normal weight and have a low risk for diabetes.  More often and at a younger age if you are overweight or have a high risk for diabetes. What should I know about preventing infection? Hepatitis B If you have a higher risk for hepatitis B, you should be screened for this virus. Talk with your health care provider to find out if you are at risk for hepatitis B infection. Hepatitis C Testing is recommended for:  Everyone born from 1945 through 1965.  Anyone with known risk factors for hepatitis C. Sexually transmitted infections (STIs)  Get screened for STIs, including gonorrhea and chlamydia, if: ? You are sexually active and are younger than 31 years of age. ? You are older than 31 years of age and your health care provider tells you that you are at risk for this type of infection. ? Your sexual activity has changed since you were last screened, and you are at increased risk for chlamydia or gonorrhea. Ask your health care provider if   you are at risk.  Ask your health care provider about whether you are at high risk for HIV. Your health care provider may recommend a prescription medicine to help prevent HIV infection. If you choose to take medicine to prevent HIV, you should first get tested for HIV. You should then be tested every 3 months for as long as you are taking the medicine. Pregnancy  If you are about to stop having your period (premenopausal) and  you may become pregnant, seek counseling before you get pregnant.  Take 400 to 800 micrograms (mcg) of folic acid every day if you become pregnant.  Ask for birth control (contraception) if you want to prevent pregnancy. Osteoporosis and menopause Osteoporosis is a disease in which the bones lose minerals and strength with aging. This can result in bone fractures. If you are 65 years old or older, or if you are at risk for osteoporosis and fractures, ask your health care provider if you should:  Be screened for bone loss.  Take a calcium or vitamin D supplement to lower your risk of fractures.  Be given hormone replacement therapy (HRT) to treat symptoms of menopause. Follow these instructions at home: Lifestyle  Do not use any products that contain nicotine or tobacco, such as cigarettes, e-cigarettes, and chewing tobacco. If you need help quitting, ask your health care provider.  Do not use street drugs.  Do not share needles.  Ask your health care provider for help if you need support or information about quitting drugs. Alcohol use  Do not drink alcohol if: ? Your health care provider tells you not to drink. ? You are pregnant, may be pregnant, or are planning to become pregnant.  If you drink alcohol: ? Limit how much you use to 0-1 drink a day. ? Limit intake if you are breastfeeding.  Be aware of how much alcohol is in your drink. In the [place] one drink equals one 12 oz bottle of beer (355 mL), one 5 oz glass of wine (148 mL), or one 1 oz glass of hard liquor (44 mL). General instructions  Schedule regular health, dental, and eye exams.  Stay current with your vaccines.  Tell your health care provider if: ? You often feel depressed. ? You have ever been abused or do not feel safe at home. Summary  Adopting a healthy lifestyle and getting preventive care are important in promoting health and wellness.  Follow your health care provider's instructions about healthy  diet, exercising, and getting tested or screened for diseases.  Follow your health care provider's instructions on monitoring your cholesterol and blood pressure. This information is not intended to replace advice given to you by your health care provider. Make sure you discuss any questions you have with your health care provider. Document Released: 06/05/2011 Document Revised: 11/13/2018 Document Reviewed: 11/13/2018 Elsevier Patient Education  2020 [employer]  

## 2019-11-25 NOTE — Progress Notes (Signed)
This visit occurred during the SARS-CoV-2 public health emergency.  Safety protocols were in place, including screening questions prior to the visit, additional usage of staff PPE, and extensive cleaning of exam room while observing appropriate contact time as indicated for disinfecting solutions.    Patient ID: Frances Powell, female  DOB: 05/07/1986, 33 y.o.   MRN: [id number] Patient Care Team    Relationship Specialty Notifications Start End  Ma Hillock, DO PCP - General Family Medicine  09/24/19   Leandrew Koyanagi, MD Attending Physician Orthopedic Surgery  09/24/19     Chief Complaint  Patient presents with  . Annual Exam    Pt is doing well with no complaints. Needs refills. Would like pap smear today     Subjective:  Frances Powell is a 33 y.o.  Female  present for CPE. All past medical history, surgical history, allergies, family history, immunizations, medications and social history were updated in the electronic medical record today. All recent labs, ED visits and hospitalizations within the last year were reviewed. Well woman exam:Patient's last menstrual period was 11/02/2019 (approximate).[id number].  She reports her last Pap was probably greater than 10 years ago.  She is in a monogamous relationship with her female partner, also of the last 10 years.  She denies any vaginal lesions, irritation or discharge.  Health maintenance:  Colonoscopy: no fhx, routine screen at 84  Mammogram: No fhx breast cancer . Routine screen. self breast exams encouraged and proper breast exam technique was demonstrated for her today and take-home pamphlet was provided. Cervical cancer screening: last pap: unknown. Completed today. Immunizations: tdap updated today, Influenza declined (encouraged yearly) Infectious disease screening: HIV if has not been completed in the past will offer next visit. DEXA: routine screen Assistive device: none Oxygen SVX:BLTJ Patient has a Dental home.  Hospitalizations/ED visits: reviewed  Depression screen [facility] 2/9 11/25/2019 09/24/2019  Decreased Interest 0 3  Down, Depressed, Hopeless 1 2  PHQ - 2 Score 1 5  Altered sleeping 1 3  Tired, decreased energy 2 3  Change in appetite 1 1  Feeling bad or failure about yourself  0 3  Trouble concentrating 2 1  Moving slowly or fidgety/restless 0 2  Suicidal thoughts 0 0  PHQ-9 Score 7 18  Difficult doing work/chores Very difficult Very difficult   GAD 7 : Generalized Anxiety Score 11/25/2019 09/24/2019  Nervous, Anxious, on Edge 3 3  Control/stop worrying 3 3  Worry too much - different things 3 3  Trouble relaxing 3 3  Restless 1 2  Easily annoyed or irritable 1 3  Afraid - awful might happen 3 3  Total GAD 7 Score 17 20  Anxiety Difficulty Very difficult -    Immunization History  Administered Date(s) Administered  . PPD Test 05/02/2019  . Tdap 11/25/2019   Past Medical History:  Diagnosis Date  . Alcohol abuse   . Depression   . Migraines    No Known Allergies Past Surgical History:  Procedure Laterality Date  . APPENDECTOMY  2010   Family History  Problem Relation Age of Onset  . Arthritis Mother   . Depression Mother   . Lupus Mother   . Hypertension Mother   . Kidney disease Mother   . Miscarriages / [place] Mother   . Leukemia Father   . Early death Father   . Kidney disease Maternal Grandmother   . Cancer Maternal Grandfather   . Heart attack Paternal Grandfather   . Early death  Sister   . Heart disease Sister   . 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    Safety:      -smoke alarm in the home:Yes     - wears seatbelt: Yes     - Feels safe in their relationships: Yes    Allergies as of 11/25/2019   No Known Allergies     Medication List       Accurate as of November 25, 2019  4:40 PM. If you have any  questions, ask your nurse or doctor.        lisinopril 5 MG tablet Commonly known as: ZESTRIL Take 1 tablet (5 mg total) by mouth daily. Started by: Howard Pouch, DO   metoprolol succinate 25 MG 24 hr tablet Commonly known as: TOPROL-XL Take 1 tablet (25 mg total) by mouth daily. Started by: Howard Pouch, DO   metoprolol tartrate 25 MG tablet Commonly known as: LOPRESSOR Take 1 tablet (25 mg total) by mouth 2 (two) times daily.   venlafaxine XR 75 MG 24 hr capsule Commonly known as: EFFEXOR-XR Take 1 capsule (75 mg total) by mouth daily with breakfast. What changed: Another medication with the same name was removed. Continue taking this medication, and follow the directions you see here. Changed by: Howard Pouch, DO      All past medical history, surgical history, allergies, family history, immunizations andmedications were updated in the EMR today and reviewed under the history and medication portions of their EMR.     No results found.  ROS: 14 pt review of systems performed and negative (unless mentioned in an HPI)  Objective: BP 138/85 (BP Location: Left Arm, Patient Position: Sitting, Cuff Size: Normal)   Pulse 61   Temp 98.4 F (36.9 C) (Oral)   Resp 17   Ht 5' 5.5" (1.664 m)   Wt 231 lb 4 oz (104.9 kg)   LMP 11/02/2019 (Approximate)   SpO2 100%   BMI 37.90 kg/m  Gen: Afebrile. No acute distress. Nontoxic in appearance, well-developed, well-nourished, very pleasant female, obese. HENT: AT. [place]. Bilateral TM visualized and normal in appearance, normal external auditory canal. MMM, no oral lesions, adequate dentition. Bilateral nares within normal limits. Throat without erythema, ulcerations or exudates.  No cough on exam, no hoarseness on exam. Eyes:Pupils Equal Round Reactive to light, Extraocular movements intact,  Conjunctiva without redness, discharge or icterus. Neck/lymp/endocrine: Supple, no lymphadenopathy, no thyromegaly CV: RRR no murmur, no edema, +2/4 P  posterior tibialis pulses.  No carotid bruits. No JVD. Chest: CTAB, no wheeze, rhonchi or crackles.  Normal respiratory effort.  Good air movement. Abd: Soft.  Obese. NTND. BS present.  No masses palpated. No hepatosplenomegaly. No rebound tenderness or guarding. Skin: No rashes, purpura or petechiae. Warm and well-perfused. Skin intact. Neuro/Msk:  Normal gait. PERLA. EOMi. Alert. Oriented x3.  Cranial nerves II through XII intact. Muscle strength 5/5 upper/lower extremity. DTRs equal bilaterally. Psych: Normal affect, dress and demeanor. Normal speech. Normal thought content and judgment. Breasts: breasts appear normal, symmetrical, no tenderness on exam, no suspicious masses, no skin other than mild hyperpigmentation bilaterally for mammary fold.  No nipple changes or axillary nodes. GYN:  External genitalia within normal limits, normal hair distribution, no lesions. Urethral meatus normal, no lesions. Vaginal mucosa pink, moist, normal rugae, no lesions. No cystocele or rectocele. cervix appeared normal, although very difficult to fully  visualize os.  Patient has a very deep pelvis and uterus is positioned posteriorly, longer speculum was needed to  visualize the cervix.  No discharge. Bimanual exam revealed normal uterus.  No bladder/suprapubic fullness, masses or tenderness. No cervical motion tenderness. No adnexal fullness. Anus and perineum within normal limits, no lesions.  No exam data present  Assessment/plan: Frances Powell is a 33 y.o. female present for CPE Essential hypertension/obesity/palpitations -Improved, although not quite at goal. Continue metoprolol 25 mg.  Refilled with metoprolol succinate, therefore she will take 1 tab per day now.  Patient reports understanding. -Start lisinopril 5 mg daily -Advised her to quit smoking and cut back on her caffeine use. -Discussed low-sodium diet -Routine exercise - Comp Met (CMET) - Lipid panel Follow-up 1 month secondary to  changes made today.  Then every 6 months.  Depression with anxiety Stable.  Doing well on Effexor 75 mg daily.  Refills provided for her today.  She is aware to only take 1 tab after refill as each tab will be 75 mg. Follow-up 6 months on chronic medical conditions.  Diabetes mellitus screening - HgB A1c Cervical cancer screening Extremely difficult exam.  Would encourage gynecological referral for future Paps. - Cytology - PAP( ) Need for Td vaccine - Tdap vaccine greater than or equal to 7yo IM  Encounter for preventative health maintenance exam: Patient was encouraged to exercise greater than 150 minutes a week. Patient was encouraged to choose a diet filled with fresh fruits and vegetables, and lean meats. AVS provided to patient today for education/recommendation on gender specific health and safety maintenance. Colonoscopy: no fhx, routine screen at 63  Mammogram: No fhx breast cancer . Routine screen. self breast exams encouraged and proper breast exam technique was demonstrated for her today and take-home pamphlet was provided. Cervical cancer screening: last pap: unknown. Completed today. Immunizations: tdap updated today, Influenza declined (encouraged yearly) Infectious disease screening: HIV if has not been completed in the past will offer next visit. DEXA: routine screen  Return in about 1 year (around 11/24/2020) for CPE (30 min).  Orders Placed This Encounter  Procedures  . Tdap vaccine greater than or equal to 7yo IM  . Comp Met (CMET)  . HgB A1c  . Lipid panel    Annual physical completed today as well as greater than 10 minutes spent with patient, greater than 50% of that time face-to-face discussing better blood pressure management and discussing new medication.  Electronically signed by: Howard Pouch, DO [place]

## 2019-11-26 LAB — LIPID PANEL
Cholesterol: 230 mg/dL — ABNORMAL HIGH (ref <200)
HDL: 61 mg/dL (ref >=50)
LDL Cholesterol (Calc): 125 mg/dL (calc) — ABNORMAL HIGH
Non-HDL Cholesterol (Calc): 169 mg/dL (calc) — ABNORMAL HIGH (ref <130)
Total CHOL/HDL Ratio: 3.8 (calc) (ref <5.0)
Triglycerides: 307 mg/dL — ABNORMAL HIGH (ref <150)

## 2019-11-26 LAB — HEMOGLOBIN A1C
Hgb A1c MFr Bld: 5.1 % of total Hgb (ref <5.7)
Mean Plasma Glucose: 100 (calc)
eAG (mmol/L): 5.5 (calc)

## 2019-11-26 LAB — COMPREHENSIVE METABOLIC PANEL
AG Ratio: 1.7 (calc) (ref 1.0–2.5)
ALT: 30 U/L — ABNORMAL HIGH (ref 6–29)
AST: 24 U/L (ref 10–30)
Albumin: 4.7 g/dL (ref 3.6–5.1)
Alkaline phosphatase (APISO): 66 U/L (ref 31–125)
BUN: 15 mg/dL (ref 7–25)
CO2: 20 mmol/L (ref 20–32)
Calcium: 9.3 mg/dL (ref 8.6–10.2)
Chloride: 103 mmol/L (ref 98–110)
Creat: 0.84 mg/dL (ref 0.50–1.10)
Globulin: 2.8 g/dL (calc) (ref 1.9–3.7)
Glucose, Bld: 90 mg/dL (ref 65–99)
Potassium: 3.7 mmol/L (ref 3.5–5.3)
Sodium: 137 mmol/L (ref 135–146)
Total Bilirubin: 0.3 mg/dL (ref 0.2–1.2)
Total Protein: 7.5 g/dL (ref 6.1–8.1)

## 2019-12-02 LAB — CYTOLOGY - PAP
Adequacy: ABSENT
Comment: NEGATIVE
Diagnosis: NEGATIVE
High risk HPV: NEGATIVE

## 2019-12-28 ENCOUNTER — Encounter: Payer: Self-pay | Admitting: Family Medicine

## 2019-12-29 NOTE — Telephone Encounter (Signed)
Pt was called and message was left to return call. Pt can be placed with [facility] Provider if wanting today as a VV, if not she can be placed on the schedule for tomorrow (there are a couple of openings as of now). Dr Claiborne Billings has no openings today.

## 2020-01-27 ENCOUNTER — Other Ambulatory Visit: Payer: Self-pay

## 2020-01-27 ENCOUNTER — Ambulatory Visit: Payer: PRIVATE HEALTH INSURANCE | Admitting: Family Medicine

## 2020-01-27 DIAGNOSIS — Z0289 Encounter for other administrative examinations: Secondary | ICD-10-CM

## 2020-05-19 ENCOUNTER — Encounter: Payer: Self-pay | Admitting: Family Medicine

## 2020-05-19 ENCOUNTER — Other Ambulatory Visit: Payer: Self-pay

## 2020-05-19 ENCOUNTER — Ambulatory Visit: Payer: PRIVATE HEALTH INSURANCE | Admitting: Family Medicine

## 2020-05-19 VITALS — BP 164/108 | HR 72 | Temp 98.2°F | Resp 18 | Ht 66.0 in | Wt 212.4 lb

## 2020-05-19 DIAGNOSIS — F418 Other specified anxiety disorders: Secondary | ICD-10-CM

## 2020-05-19 DIAGNOSIS — R5383 Other fatigue: Secondary | ICD-10-CM

## 2020-05-19 DIAGNOSIS — R7989 Other specified abnormal findings of blood chemistry: Secondary | ICD-10-CM | POA: Diagnosis not present

## 2020-05-19 DIAGNOSIS — R238 Other skin changes: Secondary | ICD-10-CM

## 2020-05-19 DIAGNOSIS — I10 Essential (primary) hypertension: Secondary | ICD-10-CM

## 2020-05-19 DIAGNOSIS — R233 Spontaneous ecchymoses: Secondary | ICD-10-CM

## 2020-05-19 DIAGNOSIS — F17200 Nicotine dependence, unspecified, uncomplicated: Secondary | ICD-10-CM

## 2020-05-19 DIAGNOSIS — E669 Obesity, unspecified: Secondary | ICD-10-CM

## 2020-05-19 DIAGNOSIS — K591 Functional diarrhea: Secondary | ICD-10-CM

## 2020-05-19 DIAGNOSIS — F1020 Alcohol dependence, uncomplicated: Secondary | ICD-10-CM

## 2020-05-19 MED ORDER — LOSARTAN POTASSIUM 25 MG PO TABS: 25.0000 mg | ORAL_TABLET | Freq: Every day | ORAL | 1 refills | Status: DC

## 2020-05-19 MED ORDER — VENLAFAXINE HCL ER 37.5 MG PO CP24
37.5000 mg | ORAL_CAPSULE | Freq: Every day | ORAL | 0 refills | Status: DC
Start: 2020-05-19 — End: 2020-06-01

## 2020-05-19 MED ORDER — QUETIAPINE FUMARATE 25 MG PO TABS: ORAL_TABLET | ORAL | 0 refills | Status: DC

## 2020-05-19 MED ORDER — METOPROLOL SUCCINATE ER 25 MG PO TB24: 25.0000 mg | ORAL_TABLET | Freq: Every day | ORAL | 1 refills | Status: DC

## 2020-05-19 NOTE — Progress Notes (Addendum)
This visit occurred during the SARS-CoV-2 public health emergency.  Safety protocols were in place, including screening questions prior to the visit, additional usage of staff PPE, and extensive cleaning of exam room while observing appropriate contact time as indicated for disinfecting solutions.    Frances Powell , 02-04-1986, 34 y.o., female MRN: [id number] Patient Care Team    Relationship Specialty Notifications Start End  Ma Hillock, DO PCP - General Family Medicine  09/24/19   Leandrew Koyanagi, MD Attending Physician Orthopedic Surgery  09/24/19     Chief Complaint  Patient presents with  . Weight Loss    Pt has had fatigue, weight loss, easy bruising, with cough x3 months. She is concerned with drinking heavily, pint of Liquor daily      Subjective: Pt presents for an OV with multiple complaints.  She is present with her partner today which also adds to the history.  She reports she has been significantly fatigued and feels she has had weight loss.  She is noticing more easy bruising and a cough that has been present for at least 3 months.  She has a history of hypertension, and has not been routinely taking her medications.  She has a history of anxiety and stopped her medications abruptly on her own.  She is an everyday smoker.  Her partner is with her today and states she is a everyday drinker of at least 1 pint of liquor daily.  She has been an everyday drinker for at least 10 years.  Patient reports she is trying to cut back on her drinking in the past but has side effects.  She reports when she was on the Effexor her anxiety was controlled, she just does not like to take medications daily.  Patient was prescribed lisinopril and cough developed. Addition patient reports daily loose stools.   Prior note: Patient presents today for new patient establishment with complaints of increased depression and anxiety.  She states she is not been on medications for depression or  anxiety in the past.  He states she is no she has had depression and anxiety symptoms for quite a few years, but she is not "big on taking medicine."Therefore she has tried to deal with it on her own.  She states she is always been a little "high strung "in general.  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  Since this time she has struggled with motivation.  Her anxiety has increased.  She would rather stay in bed the majority of the day.  She states this is new for her because she usually is always wanting to be on the go.  She reports her wife is very supportive.  She has a mother and siblings in [place].  And she has a younger brother that lives with her and her wife.  She denies SI or HI.  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    Safety:      -smoke alarm in the home:Yes     - wears seatbelt: Yes     - Feels safe in their relationships: Yes   Past Medical History:  Diagnosis Date  . Alcohol abuse   . Depression   . Migraines    Past Surgical History:  Procedure Laterality Date  . APPENDECTOMY  2010   Family History  Problem Relation Age of Onset  . Arthritis Mother   . Depression Mother   . Lupus Mother   . Hypertension Mother   . Kidney disease Mother   . Miscarriages / [place] Mother   . Leukemia Father   . Early death Father   . Kidney disease Maternal Grandmother   . Cancer Maternal  Grandfather   . Heart attack Paternal Grandfather   . Early death Sister   . Heart disease Sister   . Cervical cancer Sister    Allergies as of 05/19/2020   No Known Allergies     Medication List       Accurate as of May 19, 2020 11:59 PM. If you have any questions, ask your nurse or doctor.        STOP taking these medications   lisinopril 5 MG tablet Commonly known as: ZESTRIL Stopped by: Howard Pouch, DO     TAKE these medications   losartan 25 MG tablet Commonly known as: Cozaar Take 1 tablet (25 mg total) by mouth daily. Started by: Howard Pouch, DO   metoprolol succinate 25 MG 24 hr tablet Commonly known as: TOPROL-XL Take 1 tablet (25 mg total) by mouth daily.   QUEtiapine 25 MG tablet Commonly known as: SEROquel 1 tab before bed for 7 days, then increase to 2 tabs. Started by: Howard Pouch, DO   venlafaxine XR 37.5 MG 24 hr capsule Commonly known as: Effexor XR Take 1 capsule (37.5 mg total) by mouth daily with breakfast. What changed:   medication strength  how much to take Changed by: Howard Pouch, DO       All past medical history, surgical history, allergies, family history, immunizations andmedications were updated in the EMR today and reviewed under the history and medication portions of their EMR.     ROS: Negative, with the exception of above mentioned in HPI   Objective:  BP (!) 164/108 (BP Location: Left Arm, Patient Position: Sitting, Cuff Size: Large)   Pulse 72   Temp 98.2 F (36.8 C) (Temporal)   Resp 18   Ht 5' 6" (1.676 m)   Wt 212 lb 6 oz (96.3 kg)   LMP 05/07/2020 (Exact Date)   SpO2 100%   BMI 34.28 kg/m  Body mass index is 34.28 kg/m. Gen: Afebrile. No acute distress. Nontoxic in appearance, well developed, well nourished.  HENT: AT. [place].  No cough or hoarseness on exam today.   Eyes:Pupils Equal Round Reactive to light, Extraocular movements intact,  Conjunctiva without redness, discharge or  icterus. Neck/lymp/endocrine: Supple, no lymphadenopathy, no thyromegaly CV: RRR no murmur, no edema Chest: CTAB, no wheeze or crackles. Good air movement, normal resp effort.  Abd: Soft.  Obese. NTND. BS present.  No masses palpated. No rebound or guarding.  Skin: No rashes, purpura or petechiae.  Neuro: Normal gait. PERLA. EOMi. Alert. Oriented x3  Psych: Normal affect, dress and demeanor. Normal speech. Normal thought content and judgment.  No exam data present No results found. No  results found for this or any previous visit (from the past 24 hour(s)).  Assessment/Plan: Frances Powell is a 34 y.o. female present for OV for  Essential hypertension/obesity/palpitations -Restart metoprolol 25 mg daily -DC'd lisinopril secondary to cough, start Cozaar 25 mg daily Low-sodium diet  Depression with anxiety -She reports understanding.  She was advised never to quit this medication abruptly she reports understanding.>  Although she did quit abruptly.  Advised her again not to quit abruptly. Effexor 37.5 mg daily 7 days, then increase to 75 mg daily (which she still has 75 mg Effexor tabs at home)> will refill Effexor 75 mg on follow-up Psychology referral had been placed in the past Follow-up in 2 weeks  Elevated LFTs/easy bruising Likely from daily alcohol consumption.  Discussed cirrhosis signs and symptoms with her today.  If liver enzymes are elevated would consider further evaluation for elevated liver enzymes, ultrasound of the abdomen and consider hepatitis vaccines. - Comp Met (CMET) - Protime-INR ( [facility] ONLY) -Depending upon results consider GI referral  Alcohol dependence, daily use ([place]) Patient was encouraged to cut drinking days per week and half and cut amount consumed in half by next appointment in 2 weeks.  Of course, if she would like to quit altogether that would be encouraged as well. -Courage patient to consider AA> she has tried this in the past and does not  feel comfortable in group meetings. -She has been referred to psychology -Discussed different types of medications to assist her in decreasing cravings and alcohol cessation.  She is agreeable to start Seroquel taper nightly. -Consider Depade, gabapentin or Ativan at a later date. - Comp Met (CMET) - Protime-INR ( [facility] ONLY) - CBC w/Diff - B12 and Folate Panel - Vitamin B1 - Vitamin D (25 hydroxy)  Functional diarrhea/fatigue Likely related to her daily alcohol consumption> leading to vitamin deficiencies causing fatigue. - Comp Met (CMET) - CBC w/Diff - B12 and Folate Panel - Vitamin B1 - Vitamin D (25 hydroxy)  Tobacco use disorder Encourage her to consider quitting. Also encouraged her not to smoke at least 2 hours before her appointment-so that her blood pressure reading will be more accurate.    Reviewed expectations re: course of current medical issues.  Discussed self-management of symptoms.  Outlined signs and symptoms indicating need for more acute intervention.  Patient verbalized understanding and all questions were answered.  Patient received an After-Visit Summary.    Orders Placed This Encounter  Procedures  . Comp Met (CMET)  . Protime-INR ( [facility] ONLY)  . CBC w/Diff  . B12 and Folate Panel  . Vitamin B1  . Vitamin D (25 hydroxy)   Meds ordered this encounter  Medications  . metoprolol succinate (TOPROL-XL) 25 MG 24 hr tablet    Sig: Take 1 tablet (25 mg total) by mouth daily.    Dispense:  90 tablet    Refill:  1  . DISCONTD: losartan (COZAAR) 25 MG tablet    Sig: Take 1 tablet (25 mg total) by mouth daily.    Dispense:  90 tablet    Refill:  1  . DISCONTD: venlafaxine XR (EFFEXOR XR) 37.5 MG 24 hr capsule    Sig: Take 1 capsule (37.5 mg total) by mouth daily with breakfast.    Dispense:  7 capsule    Refill:  0  . DISCONTD: QUEtiapine (SEROQUEL) 25 MG tablet    Sig: 1 tab before bed for 7 days, then increase to 2 tabs.    Dispense:  60 tablet    Refill:  0   Referral Orders  No referral(s) requested today    > 45 Minutes was dedicated to this patient's encounter to include pre-visit review of chart, face-to-face time with patient and post-visit work- which include documentation, counseling for patient and sig other,  and prescribing medications and/or ordering test when necessary.    Note is dictated utilizing voice recognition software. Although note has been proof read prior to signing, occasional typographical errors still can be missed. If any questions arise, please do not hesitate to call for verification.   electronically signed by:  Howard Pouch, DO  [place]

## 2020-05-19 NOTE — Patient Instructions (Addendum)
Restart effexor at the 37.5 mg dose prescribed today for 7 days, then use the effexor 75 mg a day you have.   Start Seroquel 1 tab before bed for 7 days, then increase to 2 tabs before bed.   Take the metoprolol and new med LOSARTAN daily for blood pressure. Stop lisinopril.   Cut alcohol use in half and use less than 3 days a week. If able to cut back more then do it!!! But cut back at least this much for next 2 weeks.    We will call you with lab results   Follow up in 2 weeks.     Alcohol Withdrawal Syndrome When a person who drinks a lot of alcohol stops drinking, he or she may have unpleasant and serious symptoms. These symptoms are called alcohol withdrawal syndrome. This condition may be mild or severe. It can be life-threatening. It can cause:  Shaking that you cannot control (tremor).  Sweating.  Headache.  Feeling fearful, upset, grouchy, or depressed.  Trouble sleeping (insomnia).  Nightmares.  Fast or uneven heartbeats (palpitations).  Alcohol cravings.  Feeling sick to your stomach (nausea).  Throwing up (vomiting).  Being bothered by light and sounds.  Confusion.  Trouble thinking clearly.  Not being hungry (loss of appetite).  Big changes in mood (mood swings). If you have all of the following symptoms at the same time, get help right away:  High blood pressure.  Fast heartbeat.  Trouble breathing.  Seizures.  Seeing, hearing, feeling, smelling, or tasting things that are not there (hallucinations). These symptoms are known as delirium tremens (DTs). They must be treated at the hospital right away. Follow these instructions at home:   Take over-the-counter and prescription medicines only as told by your doctor. This includes vitamins.  Do not drink alcohol.  Do not drive until your doctor says that this is safe for you.  Have someone stay with you or be available in case you need help. This should be someone you trust. This person can  help you with your symptoms. He or she can also help you to not drink.  Drink enough fluid to keep your pee (urine) pale yellow.  Think about joining a support group or a treatment program to help you stop drinking.  Keep all follow-up visits as told by your doctor. This is important. Contact a doctor if:  Your symptoms get worse.  You cannot eat or drink without throwing up.  You have a hard time not drinking alcohol.  You cannot stop drinking alcohol. Get help right away if:  You have fast or uneven heartbeats.  You have chest pain.  You have trouble breathing.  You have a seizure for the first time.  You see, hear, feel, smell, or taste something that is not there.  You get very confused. Summary  When a person who drinks a lot of alcohol stops drinking, he or she may have serious symptoms. This is called alcohol withdrawal syndrome.  Delirium tremens (DTs) is a group of life-threatening symptoms. You should get help right away if you have these symptoms.  Think about joining an alcohol support group or a treatment program. This information is not intended to replace advice given to you by your health care provider. Make sure you discuss any questions you have with your health care provider. Document Revised: 11/02/2017 Document Reviewed: 07/27/2017 Elsevier Patient Education  2020 [employer].  Alcohol Abuse and Nutrition Alcohol abuse is any pattern of alcohol consumption that  harms your health, relationships, or work. Alcohol abuse can cause poor nutrition (malnutrition or malnourishment) and a lack of nutrients (nutrient deficiencies), which can lead to more complications. Alcohol abuse brings malnutrition and nutrient deficiencies in two ways:  It causes your liver to work abnormally. This affects how your body divides (breaks down) and absorbs nutrients from food.  It causes you to eat poorly. Many people who abuse alcohol do not eat enough carbohydrates,  protein, fat, vitamins, and minerals. Nutrients that are commonly lacking (deficient) in people who abuse alcohol include:  Vitamins. ? Vitamin A. This is needed for your vision, metabolism, and ability to fight off infections (immunity). ? B vitamins. These include folate, thiamine, and niacin. These are needed for new cell growth. ? Vitamin C. This plays an important role in wound healing, immunity, and helping your body to absorb iron. ? Vitamin D. This is necessary for your body to absorb and use calcium. It is produced by your liver, but you can also get it from food and from sun exposure.  Minerals. ? Calcium. This is needed for healthy bones as well as heart and blood vessel (cardiovascular) function. ? Iron. This is important for blood, muscle, and nervous system functioning. ? Magnesium. This plays an important role in muscle and nerve function, and it helps to control blood sugar and blood pressure. ? Zinc. This is important for the normal functioning of your nervous system and digestive system (gastrointestinal tract). If you think that you have an alcohol dependency problem, or if it is hard to stop drinking because you feel sick or different when you do not use alcohol, talk with your health care provider or another health professional about where to get help. Nutrition is an essential factor in therapy for alcohol abuse. Your health care provider or diet and nutrition specialist (dietitian) will work with you to design a plan that can help to restore nutrients to your body and prevent the risk of complications. What is my plan? Your dietitian may develop a specific eating plan that is based on your condition and any other problems that you have. An eating plan will commonly include:  A balanced diet. ? Grains: 6-8 oz (170-227 g) a day. Examples of 1 oz of whole grains include 1 cup of whole-wheat cereal,  cup of brown rice, or 1 slice of whole-wheat bread. ? Vegetables: 2-3 cups a  day. Examples of 1 cup of vegetables include 2 medium carrots, 1 large tomato, or 2 stalks of celery. ? Fruits: 1-2 cups a day. Examples of 1 cup of fruit include 1 large banana, 1 small apple, 8 large strawberries, or 1 large orange. ? Meat and other protein: 5-6 oz (142-170 g) a day.  A cut of meat or fish that is the size of a deck of cards is about 3-4 oz.  Foods that provide 1 oz of protein include 1 egg,  cup of nuts or seeds, or 1 tablespoon (16 g) of peanut butter. ? Dairy: 2-3 cups a day. Examples of 1 cup of dairy include 8 oz (230 mL) of milk, 8 oz (230 g) of yogurt, or 1 oz (44 g) of natural cheese.  Vitamin and mineral supplements. What are tips for following this plan?  Eat frequent meals and snacks. Try to eat 5-6 small meals each day.  Take vitamin or mineral supplements as recommended by your dietitian.  If you are malnourished or if your dietitian recommends it: ? You may follow a high-protein,  high-calorie diet. This may include:  2,000-3,000 calories (kilocalories) a day.  70-100 g (grams) of protein a day. ? You may be directed to follow a diet that includes a complete nutritional supplement beverage. This can help to restore calories, protein, and vitamins to your body. Depending on your condition, you may be advised to consume this beverage instead of your meals or in addition to them.  Certain medicines may cause changes in your appetite, taste, and weight. Work with your health care provider and dietitian to make any changes to your medicines and eating plan.  If you are unable to take in enough food and calories by mouth, your health care provider may recommend a feeding tube. This tube delivers nutritional supplements directly to your stomach. Recommended foods  Eat foods that are high in molecules that prevent oxygen from reacting with your food (antioxidants). These foods include grapes, berries, nuts, green tea, and dark green or orange vegetables. Eating  these can help to prevent some of the stress that is placed on your liver by consuming alcohol.  Eat a variety of fresh fruits and vegetables each day. This will help you to get fiber and vitamins in your diet.  Drink plenty of water and other clear fluids, such as apple juice and broth. Try to drink at least 48-64 oz (1.5-2 L) of water a day.  Include foods fortified with vitamins and minerals in your diet. Commonly fortified foods include milk, orange juice, cereal, and bread.  Eat a variety of foods that are high in omega-3 and omega-6 fatty acids. These include fish, nuts and seeds, and soybeans. These foods may help your liver to recover and may also stabilize your mood.  If you are a vegetarian: ? Eat a variety of protein-rich foods. ? Pair whole grains with plant-based proteins at meals and snack time. For example, eat rice with beans, put peanut butter on whole-grain toast, or eat oatmeal with sunflower seeds. The items listed above may not be a complete list of foods and beverages you can eat. Contact a dietitian for more information. Foods to avoid  Avoid foods and drinks that are high in fat and sugar. Sugary drinks, salty snacks, and candy contain empty calories. This means that they lack important nutrients such as protein, fiber, and vitamins.  Avoid alcohol. This is the best way to avoid malnutrition due to alcohol abuse. If you must drink, drink measured amounts. Measured drinking means limiting your intake to no more than 1 drink a day for nonpregnant women and 2 drinks a day for men. One drink equals 12 oz (355 mL) of beer, 5 oz (148 mL) of wine, or 1 oz (44 mL) of hard liquor.  Limit your intake of caffeine. Replace drinks like coffee and black tea with decaffeinated coffee and decaffeinated herbal tea. The items listed above may not be a complete list of foods and beverages you should avoid. Contact a dietitian for more information. Summary  Alcohol abuse can cause poor  nutrition (malnutrition or malnourishment) and a lack of nutrients (nutrient deficiencies), which can lead to more health problems.  Common nutrient deficiencies include vitamin deficiencies (A, B, C, and D) and mineral deficiencies (calcium, iron, magnesium, and zinc).  Nutrition is an essential factor in therapy for alcohol abuse.  Your health care provider and dietitian can help you to develop a specific eating plan that includes a balanced diet plus vitamin and mineral supplements. This information is not intended to replace advice given to  you by your health care provider. Make sure you discuss any questions you have with your health care provider. Document Revised: 03/11/2019 Document Reviewed: 08/07/2017 Elsevier Patient Education  2020 [employer].

## 2020-05-20 ENCOUNTER — Telehealth: Payer: Self-pay | Admitting: Family Medicine

## 2020-05-20 DIAGNOSIS — F1020 Alcohol dependence, uncomplicated: Secondary | ICD-10-CM

## 2020-05-20 DIAGNOSIS — E559 Vitamin D deficiency, unspecified: Secondary | ICD-10-CM | POA: Insufficient documentation

## 2020-05-20 DIAGNOSIS — R233 Spontaneous ecchymoses: Secondary | ICD-10-CM

## 2020-05-20 DIAGNOSIS — R7989 Other specified abnormal findings of blood chemistry: Secondary | ICD-10-CM

## 2020-05-20 DIAGNOSIS — K909 Intestinal malabsorption, unspecified: Secondary | ICD-10-CM

## 2020-05-20 DIAGNOSIS — K591 Functional diarrhea: Secondary | ICD-10-CM

## 2020-05-20 MED ORDER — THIAMINE HCL 100 MG PO TABS
100.0000 mg | ORAL_TABLET | Freq: Every day | ORAL | 1 refills | Status: DC
Start: 2020-05-20 — End: 2021-09-07

## 2020-05-20 MED ORDER — VITAMIN D (CHOLECALCIFEROL) 25 MCG (1000 UT) PO TABS: ORAL_TABLET | ORAL | 3 refills | Status: DC

## 2020-05-20 MED ORDER — VITAMIN D (ERGOCALCIFEROL) 1.25 MG (50000 UNIT) PO CAPS: 50000.0000 [IU] | ORAL_CAPSULE | ORAL | 0 refills | Status: DC

## 2020-05-20 MED ORDER — VITAMIN B-12 1000 MCG PO TABS: 1000.0000 ug | ORAL_TABLET | Freq: Every day | ORAL | 1 refills | Status: DC

## 2020-05-20 NOTE — Telephone Encounter (Signed)
Please inform patient - Both liver enzymes are elevated.   - Her vitamin D is drastically low with a level 8, normal levels are above 30 and ideally between 40-50 to maintain bone health. -Her B12 is low at 317, ideal levels at least above 500. -Her blood cell counts are normal and she is not anemic and her PT/INR are normal.   -In addition to the medications we discussed yesterday and have already been called in.  I have called in supplements for her to start ASAP.    -Thiamine/B1 100 mg daily    -B12 1000 mcg daily     -Vitamin D 50,000 units once weekly-pick a day such as Sunday and stay with same day for 12 weeks until completed.     -Vitamin D 1000 units daily-this is to be taken daily except for on the day of the high-dose vitamin D.  If B12 and the daily dose of vitamin D are not covered by her insurance, she can pick these up over-the-counter at the doses mentioned above.   Lastly, we need to further evaluate her liver health.  I have placed orders for an ultrasound at the [facility] location and additional labs to be collected by lab appointment here.   - Please schedule her for a lab appointment 2-3 days before her follow-up appointment with me in 2 weeks.    Hopefully her ultrasound will also be scheduled before then and we will review all results at her 2-week follow-up

## 2020-05-20 NOTE — Telephone Encounter (Signed)
Pt was called and VM was left to return call  °

## 2020-05-24 LAB — CBC WITH DIFFERENTIAL/PLATELET
Absolute Monocytes: 560 cells/uL (ref 200–950)
Basophils Absolute: 30 cells/uL (ref 0–200)
Basophils Relative: 0.3 %
Eosinophils Absolute: 150 cells/uL (ref 15–500)
Eosinophils Relative: 1.5 %
HCT: 35.9 % (ref 35.0–45.0)
Hemoglobin: 12.3 g/dL (ref 11.7–15.5)
Lymphs Abs: 3100 cells/uL (ref 850–3900)
MCH: 33.6 pg — ABNORMAL HIGH (ref 27.0–33.0)
MCHC: 34.3 g/dL (ref 32.0–36.0)
MCV: 98.1 fL (ref 80.0–100.0)
MPV: 10.4 fL (ref 7.5–12.5)
Monocytes Relative: 5.6 %
Neutro Abs: 6160 cells/uL (ref 1500–7800)
Neutrophils Relative %: 61.6 %
Platelets: 339 10*3/uL (ref 140–400)
RBC: 3.66 10*6/uL — ABNORMAL LOW (ref 3.80–5.10)
RDW: 14.4 % (ref 11.0–15.0)
Total Lymphocyte: 31 %
WBC: 10 10*3/uL (ref 3.8–10.8)

## 2020-05-24 LAB — COMPREHENSIVE METABOLIC PANEL
AG Ratio: 1.6 (calc) (ref 1.0–2.5)
ALT: 40 U/L — ABNORMAL HIGH (ref 6–29)
AST: 49 U/L — ABNORMAL HIGH (ref 10–30)
Albumin: 4.5 g/dL (ref 3.6–5.1)
Alkaline phosphatase (APISO): 73 U/L (ref 31–125)
BUN: 10 mg/dL (ref 7–25)
CO2: 20 mmol/L (ref 20–32)
Calcium: 9.3 mg/dL (ref 8.6–10.2)
Chloride: 104 mmol/L (ref 98–110)
Creat: 0.86 mg/dL (ref 0.50–1.10)
Globulin: 2.8 g/dL (calc) (ref 1.9–3.7)
Glucose, Bld: 81 mg/dL (ref 65–99)
Potassium: 3.9 mmol/L (ref 3.5–5.3)
Sodium: 140 mmol/L (ref 135–146)
Total Bilirubin: 0.3 mg/dL (ref 0.2–1.2)
Total Protein: 7.3 g/dL (ref 6.1–8.1)

## 2020-05-24 LAB — B12 AND FOLATE PANEL
Folate: 6.3 ng/mL
Vitamin B-12: 317 pg/mL (ref 200–1100)

## 2020-05-24 LAB — PROTIME-INR
INR: 1
Prothrombin Time: 10.5 s (ref 9.0–11.5)

## 2020-05-24 LAB — VITAMIN B1: Vitamin B1 (Thiamine): 13 nmol/L (ref 8–30)

## 2020-05-24 LAB — VITAMIN D 25 HYDROXY (VIT D DEFICIENCY, FRACTURES): Vit D, 25-Hydroxy: 8 ng/mL — ABNORMAL LOW (ref 30–100)

## 2020-05-25 ENCOUNTER — Encounter: Payer: Self-pay | Admitting: Family Medicine

## 2020-05-25 DIAGNOSIS — K591 Functional diarrhea: Secondary | ICD-10-CM | POA: Insufficient documentation

## 2020-05-25 DIAGNOSIS — R5383 Other fatigue: Secondary | ICD-10-CM | POA: Insufficient documentation

## 2020-05-25 DIAGNOSIS — R7989 Other specified abnormal findings of blood chemistry: Secondary | ICD-10-CM | POA: Insufficient documentation

## 2020-05-25 DIAGNOSIS — F1020 Alcohol dependence, uncomplicated: Secondary | ICD-10-CM | POA: Insufficient documentation

## 2020-05-25 DIAGNOSIS — R233 Spontaneous ecchymoses: Secondary | ICD-10-CM | POA: Insufficient documentation

## 2020-05-25 DIAGNOSIS — R238 Other skin changes: Secondary | ICD-10-CM | POA: Insufficient documentation

## 2020-05-25 HISTORY — DX: Functional diarrhea: K59.1

## 2020-05-25 NOTE — Telephone Encounter (Signed)
Pt was called and message was left to return call  

## 2020-05-26 ENCOUNTER — Other Ambulatory Visit: Payer: Self-pay

## 2020-05-26 ENCOUNTER — Ambulatory Visit (INDEPENDENT_AMBULATORY_CARE_PROVIDER_SITE_OTHER): Payer: PRIVATE HEALTH INSURANCE

## 2020-05-26 DIAGNOSIS — F1020 Alcohol dependence, uncomplicated: Secondary | ICD-10-CM | POA: Diagnosis not present

## 2020-05-26 DIAGNOSIS — R7989 Other specified abnormal findings of blood chemistry: Secondary | ICD-10-CM

## 2020-05-27 ENCOUNTER — Encounter: Payer: Self-pay | Admitting: Family Medicine

## 2020-05-27 DIAGNOSIS — R932 Abnormal findings on diagnostic imaging of liver and biliary tract: Secondary | ICD-10-CM | POA: Insufficient documentation

## 2020-05-27 NOTE — Telephone Encounter (Signed)
Pt read my chart message to call the office to review labs. Several phone attempts made. Pt has upcoming appt and labs can be reviewed at that time, unless pt returns call

## 2020-05-28 ENCOUNTER — Other Ambulatory Visit: Payer: Self-pay

## 2020-05-28 ENCOUNTER — Other Ambulatory Visit (INDEPENDENT_AMBULATORY_CARE_PROVIDER_SITE_OTHER): Payer: PRIVATE HEALTH INSURANCE

## 2020-05-28 DIAGNOSIS — R7989 Other specified abnormal findings of blood chemistry: Secondary | ICD-10-CM

## 2020-05-28 DIAGNOSIS — F1020 Alcohol dependence, uncomplicated: Secondary | ICD-10-CM

## 2020-05-28 DIAGNOSIS — K591 Functional diarrhea: Secondary | ICD-10-CM

## 2020-05-31 LAB — IRON,TIBC AND FERRITIN PANEL
%SAT: 18 % (calc) (ref 16–45)
Ferritin: 25 ng/mL (ref 16–154)
Iron: 64 ug/dL (ref 40–190)
TIBC: 357 mcg/dL (calc) (ref 250–450)

## 2020-05-31 LAB — HEPATITIS PANEL, ACUTE
Hep A IgM: NONREACTIVE
Hep B C IgM: NONREACTIVE
Hepatitis B Surface Ag: NONREACTIVE
Hepatitis C Ab: NONREACTIVE
SIGNAL TO CUT-OFF: 0.01 (ref <1.00)

## 2020-05-31 LAB — LIPASE: Lipase: 43 U/L (ref 7–60)

## 2020-05-31 LAB — MAGNESIUM: Magnesium: 1.7 mg/dL (ref 1.5–2.5)

## 2020-05-31 LAB — HIV ANTIBODY (ROUTINE TESTING W REFLEX): HIV 1&2 Ab, 4th Generation: NONREACTIVE

## 2020-06-01 ENCOUNTER — Ambulatory Visit (INDEPENDENT_AMBULATORY_CARE_PROVIDER_SITE_OTHER): Payer: PRIVATE HEALTH INSURANCE | Admitting: Family Medicine

## 2020-06-01 ENCOUNTER — Other Ambulatory Visit: Payer: Self-pay

## 2020-06-01 ENCOUNTER — Encounter: Payer: Self-pay | Admitting: Family Medicine

## 2020-06-01 VITALS — BP 142/86 | HR 61 | Temp 98.0°F | Resp 16 | Ht 66.0 in | Wt 210.5 lb

## 2020-06-01 DIAGNOSIS — F418 Other specified anxiety disorders: Secondary | ICD-10-CM

## 2020-06-01 DIAGNOSIS — I10 Essential (primary) hypertension: Secondary | ICD-10-CM

## 2020-06-01 DIAGNOSIS — F1020 Alcohol dependence, uncomplicated: Secondary | ICD-10-CM

## 2020-06-01 DIAGNOSIS — R7989 Other specified abnormal findings of blood chemistry: Secondary | ICD-10-CM

## 2020-06-01 DIAGNOSIS — K909 Intestinal malabsorption, unspecified: Secondary | ICD-10-CM

## 2020-06-01 DIAGNOSIS — K591 Functional diarrhea: Secondary | ICD-10-CM | POA: Diagnosis not present

## 2020-06-01 DIAGNOSIS — R932 Abnormal findings on diagnostic imaging of liver and biliary tract: Secondary | ICD-10-CM

## 2020-06-01 MED ORDER — LOSARTAN POTASSIUM 50 MG PO TABS: 50.0000 mg | ORAL_TABLET | Freq: Every day | ORAL | 0 refills | Status: DC

## 2020-06-01 MED ORDER — VENLAFAXINE HCL ER 75 MG PO CP24
75.0000 mg | ORAL_CAPSULE | Freq: Every day | ORAL | 0 refills | Status: DC
Start: 2020-06-01 — End: 2020-10-19

## 2020-06-01 MED ORDER — GABAPENTIN 300 MG PO CAPS: 300.0000 mg | ORAL_CAPSULE | Freq: Three times a day (TID) | ORAL | 0 refills | Status: DC

## 2020-06-01 MED ORDER — QUETIAPINE FUMARATE 50 MG PO TABS: 50.0000 mg | ORAL_TABLET | Freq: Every day | ORAL | 0 refills | Status: DC

## 2020-06-01 MED ORDER — NALTREXONE HCL 50 MG PO TABS: 50.0000 mg | ORAL_TABLET | Freq: Every day | ORAL | 0 refills | Status: DC

## 2020-06-01 NOTE — Patient Instructions (Addendum)
Gabapentin:  Day 1-3: 1 tab every 8 hours.  Day 4: 1 tab every 12 hours.  Day 5: 1 tab mid day  Continue effexor at 75 mg.  Continue seroquel at 50 mg before bed.   Increase losartan to 50 mg a day.  Continue metoprolol at current dose.   depade/naltrexone- start the day gabapentin is stopped   Follow up  2 weeks.     Alcohol Withdrawal Syndrome When a person who drinks a lot of alcohol stops drinking, he or she may have unpleasant and serious symptoms. These symptoms are called alcohol withdrawal syndrome. This condition may be mild or severe. It can be life-threatening. It can cause:  Shaking that you cannot control (tremor).  Sweating.  Headache.  Feeling fearful, upset, grouchy, or depressed.  Trouble sleeping (insomnia).  Nightmares.  Fast or uneven heartbeats (palpitations).  Alcohol cravings.  Feeling sick to your stomach (nausea).  Throwing up (vomiting).  Being bothered by light and sounds.  Confusion.  Trouble thinking clearly.  Not being hungry (loss of appetite).  Big changes in mood (mood swings). If you have all of the following symptoms at the same time, get help right away:  High blood pressure.  Fast heartbeat.  Trouble breathing.  Seizures.  Seeing, hearing, feeling, smelling, or tasting things that are not there (hallucinations). These symptoms are known as delirium tremens (DTs). They must be treated at the hospital right away. Follow these instructions at home:   Take over-the-counter and prescription medicines only as told by your doctor. This includes vitamins.  Do not drink alcohol.  Do not drive until your doctor says that this is safe for you.  Have someone stay with you or be available in case you need help. This should be someone you trust. This person can help you with your symptoms. He or she can also help you to not drink.  Drink enough fluid to keep your pee (urine) pale yellow.  Think about joining a support  group or a treatment program to help you stop drinking.  Keep all follow-up visits as told by your doctor. This is important. Contact a doctor if:  Your symptoms get worse.  You cannot eat or drink without throwing up.  You have a hard time not drinking alcohol.  You cannot stop drinking alcohol. Get help right away if:  You have fast or uneven heartbeats.  You have chest pain.  You have trouble breathing.  You have a seizure for the first time.  You see, hear, feel, smell, or taste something that is not there.  You get very confused. Summary  When a person who drinks a lot of alcohol stops drinking, he or she may have serious symptoms. This is called alcohol withdrawal syndrome.  Delirium tremens (DTs) is a group of life-threatening symptoms. You should get help right away if you have these symptoms.  Think about joining an alcohol support group or a treatment program. This information is not intended to replace advice given to you by your health care provider. Make sure you discuss any questions you have with your health care provider. Document Revised: 11/02/2017 Document Reviewed: 07/27/2017 Elsevier Patient Education  2020 [employer].

## 2020-06-01 NOTE — Progress Notes (Signed)
This visit occurred during the SARS-CoV-2 public health emergency.  Safety protocols were in place, including screening questions prior to the visit, additional usage of staff PPE, and extensive cleaning of exam room while observing appropriate contact time as indicated for disinfecting solutions.    Frances Powell , 09-23-1986, 34 y.o., female MRN: [id number] Patient Care Team    Relationship Specialty Notifications Start End  Ma Hillock, DO PCP - General Family Medicine  09/24/19   Leandrew Koyanagi, MD Attending Physician Orthopedic Surgery  09/24/19     Chief Complaint  Patient presents with  . Follow-up    F/U for HTN. Checking BP at home.      Subjective: Pt presents for an OV for follow up Pt reports compliance with metoprolol 25 mg QD and  losartan 25 mg QD. Blood pressures ranges at home are improved but still above goal. Patient denies chest pain, shortness of breath or lower extremity edema.   Malabsorption syndrome/Functional diarrhea She has started supplementation with b12, b1, vit d. She has seen improvement.   Depression with anxiety She has tapered back onto effexor 75 mg a day. She has started seroquel 50 mg QHS. She has seen improvement.   Alcohol dependence, daily use (HCC)/Elevated LFTs/Highly echogenic liver on ultrasound She has cut back to < 3 days a week and < 1/2 of alcohol use on those days. She feels the regimen prescribed has helped her with anxiety and cravings. She is ready to quit. abd [place] results reviewed with her in person today- and all prior labs in detail.   ABD [place] 05/26/2020:  IMPRESSION: 1. Heterogeneous echogenic liver consistent with steatosis and or hepatocellular disease. Questionable micro nodularity of the parenchymal pattern. Hypoechoic area near the gallbladder fossa probably represent sparing. Could consider further evaluation with MRI. 2. Otherwise negative abdominal ultrasound  Depression screen [facility] 2/9 11/25/2019  09/24/2019  Decreased Interest 0 3  Down, Depressed, Hopeless 1 2  PHQ - 2 Score 1 5  Altered sleeping 1 3  Tired, decreased energy 2 3  Change in appetite 1 1  Feeling bad or failure about yourself  0 3  Trouble concentrating 2 1  Moving slowly or fidgety/restless 0 2  Suicidal thoughts 0 0  PHQ-9 Score 7 18  Difficult doing work/chores Very difficult Very difficult   GAD 7 : Generalized Anxiety Score 11/25/2019 09/24/2019  Nervous, Anxious, on Edge 3 3  Control/stop worrying 3 3  Worry too much - different things 3 3  Trouble relaxing 3 3  Restless 1 2  Easily annoyed or irritable 1 3  Afraid - awful might happen 3 3  Total GAD 7 Score 17 20  Anxiety Difficulty Very difficult -     No Known Allergies Social History   Social History Narrative   Marital status/children/pets: married   Education/employment: 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    Safety:      -smoke alarm in the home:Yes     - wears seatbelt: Yes     - Feels safe in their relationships: Yes   Past Medical History:  Diagnosis Date  . Alcohol abuse   . Depression   . Migraines    Past Surgical History:  Procedure Laterality Date  . APPENDECTOMY  2010   Family History  Problem Relation Age of Onset  . Arthritis Mother   . Depression Mother   . Lupus Mother   . Hypertension Mother   . Kidney disease Mother   .  Miscarriages / [place] Mother   . Leukemia Father   . Early death Father   . Kidney disease Maternal Grandmother   . Cancer Maternal Grandfather   . Heart attack Paternal Grandfather   . Early death Sister   . Heart disease Sister   . Cervical cancer Sister    Allergies as of 06/01/2020   No Known Allergies     Medication List       Accurate as of June 01, 2020  2:36 PM. If you have any questions, ask your nurse or doctor.        losartan 25 MG tablet Commonly known as: Cozaar Take 1 tablet (25 mg total) by mouth daily.   metoprolol succinate 25 MG 24 hr  tablet Commonly known as: TOPROL-XL Take 1 tablet (25 mg total) by mouth daily.   QUEtiapine 25 MG tablet Commonly known as: SEROquel 1 tab before bed for 7 days, then increase to 2 tabs.   thiamine 100 MG tablet Take 1 tablet (100 mg total) by mouth daily.   venlafaxine XR 37.5 MG 24 hr capsule Commonly known as: Effexor XR Take 1 capsule (37.5 mg total) by mouth daily with breakfast.   vitamin B-12 1000 MCG tablet Commonly known as: CYANOCOBALAMIN Take 1 tablet (1,000 mcg total) by mouth daily.   Vitamin D (Cholecalciferol) 25 MCG (1000 UT) Tabs 1 tab daily, except for the day high-dose vitamin D tab is taken   Vitamin D (Ergocalciferol) 1.25 MG (50000 UNIT) Caps capsule Commonly known as: DRISDOL Take 1 capsule (50,000 Units total) by mouth every 7 (seven) days.       All past medical history, surgical history, allergies, family history, immunizations andmedications were updated in the EMR today and reviewed under the history and medication portions of their EMR.     ROS: Negative, with the exception of above mentioned in HPI   Objective:  BP (!) 142/86 (BP Location: Left Arm, Patient Position: Sitting, Cuff Size: Large)   Pulse 61   Temp 98 F (36.7 C) (Temporal)   Resp 16   Ht 5' 6"  (1.676 m)   Wt 210 lb 8 oz (95.5 kg)   LMP 05/07/2020 (Exact Date)   SpO2 98%   BMI 33.98 kg/m  Body mass index is 33.98 kg/m. Gen: Afebrile. No acute distress. Pleasant female.  HENT: AT. [place].  Eyes:Pupils Equal Round Reactive to light, Extraocular movements intact,  Conjunctiva without redness, discharge or icterus. CV: RRR noedema, +2/4 P posterior tibialis pulses Chest: CTAB, no wheeze or crackles Abd: Soft.  NTND. BS present. no Masses palpated.  Skin: norashes, purpura or petechiae.  Neuro: Normal gait. PERLA. EOMi. Alert. Oriented.  Psych: Normal affect, dress and demeanor. Normal speech. Normal thought content and judgment.  No exam data present No results found. No  results found for this or any previous visit (from the past 24 hour(s)).  Assessment/Plan: [name] is a 34 y.o. female present for OV for  Essential hypertension/obesity/palpitations -improved, still above goal.  - continue  metoprolol 25 mg daily -increase  Cozaar 25> 50 mg daily Low-sodium diet  Depression with anxiety - Continue effexor 75 mg QD - continue seroquel 50 mg QHS  -  She was advised never to quit this medication abruptly she reports understanding.  Malabsorption syndrome/Functional diarrhea Continue vitamin supplementation Reducing alcohol should help.   Alcohol dependence, daily use (HCC)Elevated LFTs/Highly echogenic liver on ultrasound Successfully cut alcohol use to 1/4-1/2 of her prior use. She is ready  to stop alcohol use now.  - Continue seroquel 50 mg QD - pick the quit date. Ensure all alcohol is removed from the home.  - Start gabapentin - tapering instructions provided to her.  - after gabapentin 5 days use> start depade -Courage patient to consider AA> she has tried this in the past and does not feel comfortable in group meetings. -She has been referred to psychology - discussed hep vaccine start recommended. Will start next appt if she is agreeable.  Pt was provided with education on alcohol withdraw and emergent precautions.  F/u 2 weeks.   Functional diarrhea/fatigue Likely related to her daily alcohol consumption> leading to vitamin deficiencies causing fatigue. - Comp Met (CMET) - CBC w/Diff - B12 and Folate Panel - Vitamin B1 - Vitamin D (25 hydroxy)  Tobacco use disorder Encourage her to consider quitting. Also encouraged her not to smoke at least 2 hours before her appointment-so that her blood pressure reading will be more accurate.    Reviewed expectations re: course of current medical issues.  Discussed self-management of symptoms.  Outlined signs and symptoms indicating need for more acute intervention.  Patient  verbalized understanding and all questions were answered.  Patient received an After-Visit Summary.    No orders of the defined types were placed in this encounter.  No orders of the defined types were placed in this encounter.  Referral Orders  No referral(s) requested today     Note is dictated utilizing voice recognition software. Although note has been proof read prior to signing, occasional typographical errors still can be missed. If any questions arise, please do not hesitate to call for verification.   electronically signed by:  Howard Pouch, DO  [place]

## 2020-06-09 ENCOUNTER — Telehealth: Payer: Self-pay

## 2020-06-09 ENCOUNTER — Encounter: Payer: Self-pay | Admitting: Family Medicine

## 2020-06-09 NOTE — Telephone Encounter (Signed)
Letter complete.  Patient aware.  She will print it from MyChart.

## 2020-06-09 NOTE — Telephone Encounter (Addendum)
Contacted patient, she states that Dr Claiborne Billings put her on a 5 day taper down of Gabapentin, she is on her 4th day of that, she states she just needs a work note for 7/5-7/7.  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  Please advise if this is an appropriate ask.  I don't see any mention of time off in her last OV notes.

## 2020-06-09 NOTE — Telephone Encounter (Signed)
Patient called in stating that on her last OV 06/01/20 with Dr she started her on a new medication, and was told she may needed to take a few days off. She didn't think she needed a office note for work but does wanting to see if she could get that for work tomorrow   gabapentin (NEURONTIN) 300 MG capsule  Please call and advise

## 2020-06-09 NOTE — Telephone Encounter (Signed)
OK to do work note excusing her from duties 7/5, 7/6, and 7/7.

## 2020-06-21 ENCOUNTER — Ambulatory Visit: Payer: PRIVATE HEALTH INSURANCE | Admitting: Family Medicine

## 2020-06-21 DIAGNOSIS — Z0289 Encounter for other administrative examinations: Secondary | ICD-10-CM

## 2020-07-01 ENCOUNTER — Encounter: Payer: Self-pay | Admitting: Family Medicine

## 2020-07-05 ENCOUNTER — Ambulatory Visit: Payer: PRIVATE HEALTH INSURANCE | Admitting: Family Medicine

## 2020-07-08 ENCOUNTER — Emergency Department (INDEPENDENT_AMBULATORY_CARE_PROVIDER_SITE_OTHER)
Admission: EM | Admit: 2020-07-08 | Discharge: 2020-07-08 | Disposition: A | Discharge status: Home / Self Care | Payer: Self-pay | Source: Home / Self Care

## 2020-07-08 ENCOUNTER — Other Ambulatory Visit: Payer: Self-pay

## 2020-07-08 ENCOUNTER — Encounter: Payer: Self-pay | Admitting: Emergency Medicine

## 2020-07-08 DIAGNOSIS — M542 Cervicalgia: Secondary | ICD-10-CM

## 2020-07-08 DIAGNOSIS — R519 Headache, unspecified: Secondary | ICD-10-CM

## 2020-07-08 NOTE — Discharge Instructions (Signed)
  You may take 500mg  acetaminophen every 4-6 hours or in combination with ibuprofen 400-600mg  every 6-8 hours as needed for pain and inflammation.   Follow up with your primary care provider as previously scheduled tomorrow.  Call 911 or have someone drive you to the hospital if symptoms significantly worsening- severe headache, change in vision, dizziness/passing out, or other new concerning symptoms develop.
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
## 2020-07-09 ENCOUNTER — Ambulatory Visit: Payer: PRIVATE HEALTH INSURANCE | Admitting: Family Medicine

## 2020-07-19 ENCOUNTER — Ambulatory Visit: Payer: PRIVATE HEALTH INSURANCE | Admitting: Family Medicine

## 2020-10-19 ENCOUNTER — Encounter: Payer: Self-pay | Admitting: Family Medicine

## 2020-10-19 ENCOUNTER — Other Ambulatory Visit: Payer: Self-pay

## 2020-10-19 ENCOUNTER — Ambulatory Visit (INDEPENDENT_AMBULATORY_CARE_PROVIDER_SITE_OTHER): Payer: PRIVATE HEALTH INSURANCE | Admitting: Family Medicine

## 2020-10-19 VITALS — BP 150/99 | HR 66 | Temp 97.6°F | Ht 64.0 in | Wt 214.0 lb

## 2020-10-19 DIAGNOSIS — R1012 Left upper quadrant pain: Secondary | ICD-10-CM

## 2020-10-19 DIAGNOSIS — F1020 Alcohol dependence, uncomplicated: Secondary | ICD-10-CM

## 2020-10-19 DIAGNOSIS — E669 Obesity, unspecified: Secondary | ICD-10-CM

## 2020-10-19 DIAGNOSIS — I10 Essential (primary) hypertension: Secondary | ICD-10-CM

## 2020-10-19 DIAGNOSIS — F418 Other specified anxiety disorders: Secondary | ICD-10-CM | POA: Diagnosis not present

## 2020-10-19 MED ORDER — QUETIAPINE FUMARATE 50 MG PO TABS: 50.0000 mg | ORAL_TABLET | Freq: Every day | ORAL | 1 refills | Status: DC

## 2020-10-19 MED ORDER — LOSARTAN POTASSIUM 100 MG PO TABS: 100.0000 mg | ORAL_TABLET | Freq: Every day | ORAL | 1 refills | Status: DC

## 2020-10-19 MED ORDER — NALTREXONE HCL 50 MG PO TABS: 50.0000 mg | ORAL_TABLET | Freq: Every day | ORAL | 1 refills | Status: DC

## 2020-10-19 MED ORDER — VENLAFAXINE HCL ER 75 MG PO CP24: 75.0000 mg | ORAL_CAPSULE | Freq: Every day | ORAL | 1 refills | Status: DC

## 2020-10-19 MED ORDER — METOPROLOL SUCCINATE ER 25 MG PO TB24: 25.0000 mg | ORAL_TABLET | Freq: Every day | ORAL | 1 refills | Status: DC

## 2020-10-19 MED ORDER — GABAPENTIN 300 MG PO CAPS: 300.0000 mg | ORAL_CAPSULE | Freq: Three times a day (TID) | ORAL | 5 refills | Status: DC

## 2020-10-19 NOTE — Patient Instructions (Signed)
Gastroparesis  Gastroparesis is a condition in which food takes longer than normal to empty from the stomach. The condition is usually long-lasting (chronic). It may also be called delayed gastric emptying. There is no cure, but there are treatments and things that you can do at home to help relieve symptoms. Treating the underlying condition that causes gastroparesis can also help relieve symptoms. What are the causes? In many cases, the cause of this condition is not known. Possible causes include:  A hormone (endocrine) disorder, such as hypothyroidism or diabetes.  A nervous system disease, such as Parkinson's disease or multiple sclerosis.  Cancer, infection, or surgery that affects the stomach or vagus nerve. The vagus nerve runs from your chest, through your neck, to the lower part of your brain.  A connective tissue disorder, such as scleroderma.  Certain medicines. What increases the risk? You are more likely to develop this condition if you:  Have certain disorders or diseases, including: ? An endocrine disorder. ? An eating disorder. ? Amyloidosis. ? Scleroderma. ? Parkinson's disease. ? Multiple sclerosis. ? Cancer or infection of the stomach or the vagus nerve.  Have had surgery on the stomach or vagus nerve.  Take certain medicines.  Are female. What are the signs or symptoms? Symptoms of this condition include:  Feeling full after eating very little.  Nausea.  Vomiting.  Heartburn.  Abdominal bloating.  Inconsistent blood sugar (glucose) levels on blood tests.  Lack of appetite.  Weight loss.  Acid from the stomach coming up into the esophagus (gastroesophageal reflux).  Sudden tightening (spasm) of the stomach, which can be painful. Symptoms may come and go. Some people may not notice any symptoms. How is this diagnosed? This condition is diagnosed with tests, such as:  Tests that check how long it takes food to move through the  stomach and intestines. These tests include: ? Upper gastrointestinal (GI) series. For this test, you drink a liquid that shows up well on X-rays, and then X-rays will be taken of your intestines. ? Gastric emptying scintigraphy. For this test, you eat food that contains a small amount of radioactive material, and then scans are taken. ? Wireless capsule GI monitoring system. For this test, you swallow a pill (capsule) that records information about how foods and fluid move through your stomach.  Gastric manometry. For this test, a tube is passed down your throat and into your stomach to measure electrical and muscular activity.  Endoscopy. For this test, a long, thin tube is passed down your throat and into your stomach to check for problems in your stomach lining.  Ultrasound. This test uses sound waves to create images of inside the body. This can help rule out gallbladder disease or pancreatitis as a cause of your symptoms. How is this treated? There is no cure for gastroparesis. Treatment may include:  Treating the underlying cause.  Managing your symptoms by making changes to your diet and exercise habits.  Taking medicines to control nausea and vomiting and to stimulate stomach muscles.  Getting food through a feeding tube in the hospital. This may be done in severe cases.  Having surgery to insert a device into your body that helps improve stomach emptying and control nausea and vomiting (gastric neurostimulator). Follow these instructions at home:  Take over-the-counter and prescription medicines only as told by your health care provider.  Follow instructions from your health care provider about eating or drinking restrictions. Your health care provider  may recommend that you: ? Eat smaller meals more often. ? Eat low-fat foods. ? Eat low-fiber forms of high-fiber foods. For example, eat cooked vegetables instead of raw vegetables. ? Have only liquid foods instead of solid  foods. Liquid foods are easier to digest.  Drink enough fluid to keep your urine pale yellow.  Exercise as often as told by your health care provider.  Keep all follow-up visits as told by your health care provider. This is important. Contact a health care provider if you:  Notice that your symptoms do not improve with treatment.  Have new symptoms. Get help right away if you:  Have severe abdominal pain that does not improve with treatment.  Have nausea that is severe or does not go away.  Cannot drink fluids without vomiting. Summary  Gastroparesis is a chronic condition in which food takes longer than normal to empty from the stomach.  Symptoms include nausea, vomiting, heartburn, abdominal bloating, and loss of appetite.  Eating smaller portions, and low-fat, low-fiber foods may help you manage your symptoms.  Get help right away if you have severe abdominal pain. This information is not intended to replace advice given to you by your health care provider. Make sure you discuss any questions you have with your health care provider. Document Revised: 02/18/2018 Document Reviewed: 09/25/2017 Elsevier Patient Education  2020 [employer] Acute Pancreatitis  Acute pancreatitis happens when the pancreas gets swollen. The pancreas is a large gland in the body that helps to control blood sugar. It also makes enzymes that help to digest food. This condition can last a few days and cause serious problems. The lungs, heart, and kidneys may stop working. What are the causes? Causes include:  Alcohol abuse.  Drug abuse.  Gallstones.  A tumor in the pancreas. Other causes include:  Some medicines.  Some chemicals.  Diabetes.  An infection.  Damage caused by an accident.  The poison (venom) from a scorpion bite.  Belly (abdominal) surgery.  The body's defense system (immune system) attacking the pancreas (autoimmune pancreatitis).  Genes that are passed from  parent to child (inherited). In some cases, the cause is not known. What are the signs or symptoms?  Pain in the upper belly that may be felt in the back. The pain may be very bad.  Swelling of the belly.  Feeling sick to your stomach (nauseous) and throwing up (vomiting).  Fever. How is this treated? You will likely have to stay in the hospital. Treatment may include:  Pain medicine.  Fluid through an IV tube.  Placing a tube in the stomach to take out the stomach contents. This may help you stop throwing up.  Not eating for 3-4 days.  Antibiotic medicines, if you have an infection.  Treating any other problems that may be the cause.  Steroid medicines, if your problem is caused by your defense system attacking your body's own tissues.  Surgery. Follow these instructions at home: Eating and drinking   Follow instructions from your doctor about what to eat and drink.  Eat foods that do not have a lot of fat in them.  Eat small meals often. Do not eat big meals.  Drink enough fluid to keep your pee (urine) pale yellow.  Do not drink alcohol if it caused your condition. Medicines  Take over-the-counter and prescription medicines only as told by your doctor.  Ask your doctor if the medicine prescribed to you: ? Requires you to avoid driving or using heavy  machinery. ? Can cause trouble pooping (constipation). You may need to take steps to prevent or treat trouble pooping:  Take over-the-counter or prescription medicines.  Eat foods that are high in fiber. These include beans, whole grains, and fresh fruits and vegetables.  Limit foods that are high in fat and sugar. These include fried or sweet foods. General instructions  Do not use any products that contain nicotine or tobacco, such as cigarettes, e-cigarettes, and chewing tobacco. If you need help quitting, ask your doctor.  Get plenty of rest.  Check your blood sugar at home as told by your  doctor.  Keep all follow-up visits as told by your doctor. This is important. Contact a doctor if:  You do not get better as quickly as expected.  You have new symptoms.  Your symptoms get worse.  You have pain or weakness that lasts a long time.  You keep feeling sick to your stomach.  You get better and then you have pain again.  You have a fever. Get help right away if:  You cannot eat or keep fluids down.  Your pain gets very bad.  Your skin or the white part of your eyes turns yellow.  You have sudden swelling in your belly.  You throw up.  You feel dizzy or you pass out (faint).  Your blood sugar is high (over 300 mg/dL). Summary  Acute pancreatitis happens when the pancreas gets swollen.  This condition is often caused by alcohol abuse, drug abuse, or gallstones.  You will likely have to stay in the hospital for treatment. This information is not intended to replace advice given to you by your health care provider. Make sure you discuss any questions you have with your health care provider. Document Revised: 09/09/2018 Document Reviewed: 09/09/2018 Elsevier Patient Education  2020 [employer].

## 2020-10-19 NOTE — Progress Notes (Signed)
This visit occurred during the SARS-CoV-2 public health emergency.  Safety protocols were in place, including screening questions prior to the visit, additional usage of staff PPE, and extensive cleaning of exam room while observing appropriate contact time as indicated for disinfecting solutions.    Frances Powell , 22-Jun-1986, 34 y.o., female MRN: [id number] Patient Care Team    Relationship Specialty Notifications Start End  Ma Hillock, DO PCP - General Family Medicine  09/24/19   Leandrew Koyanagi, MD Attending Physician Orthopedic Surgery  09/24/19     Chief Complaint  Patient presents with  . Abdominal Pain    x 1 mo; lower abd     Subjective: Pt presents for an OV with complaints of abdominal discomfort of approximately 1 month.  Patient reports she is still drinking alcohol daily.  She discontinued the Depakote because it made her feel more nauseous.  She has been out of her Seroquel therefore she is not sleeping well.  She states she has noticed when she has been drinking alcohol through the day and then she tries to eat a meal, she feels extremely full and then vomits.  She is still having daily diarrhea from her alcohol use.  She reports with her nausea and vomiting she will have some left-sided epigastric pain.  No LMP recorded. (Menstrual status: Irregular Periods).   Depression screen [facility] 2/9 10/19/2020 11/25/2019 09/24/2019  Decreased Interest 3 0 3  Down, Depressed, Hopeless _0 PHQ - 2 Score _1 Altered sleeping _2 Tired, decreased energy _3 Change in appetite 0 1 1  Feeling bad or failure about yourself  3 0 3  Trouble concentrating _4 Moving slowly or fidgety/restless 3 0 2  Suicidal thoughts 1 0 0  PHQ-9 Score _5 Difficult doing work/chores - Very difficult Very difficult   GAD 7 : Generalized Anxiety Score 10/19/2020 11/25/2019 09/24/2019  Nervous, Anxious, on Edge _6 Control/stop worrying _7 Worry too much -  different things _8 Trouble relaxing _9 Restless 0 1 2  Easily annoyed or irritable _10 Afraid - awful might happen _11 Total GAD 7 Score _12 Anxiety Difficulty - Very difficult -    No Known Allergies Social History   Social History Narrative   Marital status/children/pets: married   Education/employment: 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    Safety:      -smoke alarm in the home:Yes     - wears seatbelt: Yes     - Feels safe in their relationships: Yes   Past Medical History:  Diagnosis Date  . Alcohol abuse   . Depression   . Migraines    Past Surgical History:  Procedure Laterality Date  . APPENDECTOMY  2010   Family History  Problem Relation Age of Onset  . Arthritis Mother   . Depression Mother   . Lupus Mother   . Hypertension Mother   . Kidney disease Mother   . Miscarriages / [place] Mother   . Leukemia Father   . Early death Father   . Kidney disease Maternal Grandmother   . Cancer Maternal Grandfather   . Heart attack Paternal Grandfather   . Early death Sister   . Heart disease Sister   . Cervical cancer Sister    Allergies as of  10/19/2020   No Known Allergies     Medication List       Accurate as of October 19, 2020  2:51 PM. If you have any questions, ask your nurse or doctor.        STOP taking these medications   gabapentin 300 MG capsule Commonly known as: NEURONTIN Stopped by: Howard Pouch, DO   naltrexone 50 MG tablet Commonly known as: DEPADE Stopped by: Howard Pouch, DO   Vitamin D (Ergocalciferol) 1.25 MG (50000 UNIT) Caps capsule Commonly known as: DRISDOL Stopped by: Howard Pouch, DO     TAKE these medications   losartan 50 MG tablet Commonly known as: Cozaar Take 1 tablet (50 mg total) by mouth daily.   metoprolol succinate 25 MG 24 hr tablet Commonly known as: TOPROL-XL Take 1 tablet (25 mg total) by mouth daily.   QUEtiapine 50 MG tablet Commonly known as: SEROquel Take 1 tablet (50  mg total) by mouth at bedtime.   thiamine 100 MG tablet Take 1 tablet (100 mg total) by mouth daily.   venlafaxine XR 75 MG 24 hr capsule Commonly known as: Effexor XR Take 1 capsule (75 mg total) by mouth daily with breakfast.   vitamin B-12 1000 MCG tablet Commonly known as: CYANOCOBALAMIN Take 1 tablet (1,000 mcg total) by mouth daily.   Vitamin D (Cholecalciferol) 25 MCG (1000 UT) Tabs 1 tab daily, except for the day high-dose vitamin D tab is taken       All past medical history, surgical history, allergies, family history, immunizations andmedications were updated in the EMR today and reviewed under the history and medication portions of their EMR.     ROS: Negative, with the exception of above mentioned in HPI   Objective:  BP (!) 150/99   Pulse 66   Temp 97.6 F (36.4 C) (Oral)   Ht _0  (1.626 m)   Wt 214 lb (97.1 kg)   SpO2 99%   BMI 36.73 kg/m  Body mass index is 36.73 kg/m. Gen: Afebrile. No acute distress. Nontoxic in appearance, well developed, well nourished.  HENT: AT. [place].  Eyes:Pupils Equal Round Reactive to light, Extraocular movements intact,  Conjunctiva without redness, discharge or icterus. Abd: Soft.  Obese.  ND.  Mild tenderness to palpation left upper quadrant.  BS present.  No masses palpated. No rebound or guarding.  MSK:no cva tenderness bilateral Skin: no rashes, purpura or petechiae.  Neuro: Normal gait. PERLA. EOMi. Alert. Oriented x3 Psych: Normal affect, dress and demeanor. Normal speech. Normal thought content and judgment.  No exam data present No results found. No results found for this or any previous visit (from the past 24 hour(s)).  Assessment/Plan: Frances Powell is a 34 y.o. female present for OV for  Left upper quadrant abdominal pain/Obesity (BMI 30-39.9) Patient's HPI seems consistent with gastritis versus pancreatitis versus gastroparesis caused by her alcohol use.  She understands this is from her daily alcohol use.   She would like to try to cut back again on her alcohol use. After labs consider gastroparesis study, PPI versus CT imaging of abdomen. - Comp Met (CMET) - Lipase - Hemoglobin A1c Consider gastroenterology referral  Depression with anxiety Patient has been noncompliant with meds. Continue Effexor 75 mg daily. Restart Seroquel 50 mg daily. Restart gabapentin 300 mg 3 times daily  Essential hypertension Patient is noncompliant with medications. Continue metoprolol 25 mg daily Increase losartan to 100 mg daily  Alcohol dependence, daily use (HCC) She would like to  restart Depade.  Encouraged her to start gabapentin for 5 days while discontinuing alcohol use.  Then start Depade again. Continue thiamine, B12 and vitamin D   Reviewed expectations re: course of current medical issues.  Discussed self-management of symptoms.  Outlined signs and symptoms indicating need for more acute intervention.  Patient verbalized understanding and all questions were answered.  Patient received an After-Visit Summary.    No orders of the defined types were placed in this encounter.  No orders of the defined types were placed in this encounter.  Referral Orders  No referral(s) requested today     Note is dictated utilizing voice recognition software. Although note has been proof read prior to signing, occasional typographical errors still can be missed. If any questions arise, please do not hesitate to call for verification.   electronically signed by:  Howard Pouch, DO  [place]

## 2020-10-20 ENCOUNTER — Telehealth: Payer: Self-pay | Admitting: Family Medicine

## 2020-10-20 LAB — HEMOGLOBIN A1C
Hgb A1c MFr Bld: 5.4 % of total Hgb (ref <5.7)
Mean Plasma Glucose: 108 (calc)
eAG (mmol/L): 6 (calc)

## 2020-10-20 LAB — COMPREHENSIVE METABOLIC PANEL
AG Ratio: 1.6 (calc) (ref 1.0–2.5)
ALT: 17 U/L (ref 6–29)
AST: 20 U/L (ref 10–30)
Albumin: 4.8 g/dL (ref 3.6–5.1)
Alkaline phosphatase (APISO): 60 U/L (ref 31–125)
BUN: 10 mg/dL (ref 7–25)
CO2: 22 mmol/L (ref 20–32)
Calcium: 10.4 mg/dL — ABNORMAL HIGH (ref 8.6–10.2)
Chloride: 103 mmol/L (ref 98–110)
Creat: 0.96 mg/dL (ref 0.50–1.10)
Globulin: 3 g/dL (calc) (ref 1.9–3.7)
Glucose, Bld: 84 mg/dL (ref 65–99)
Potassium: 3.9 mmol/L (ref 3.5–5.3)
Sodium: 140 mmol/L (ref 135–146)
Total Bilirubin: 0.4 mg/dL (ref 0.2–1.2)
Total Protein: 7.8 g/dL (ref 6.1–8.1)

## 2020-10-20 LAB — LIPASE: Lipase: 35 U/L (ref 7–60)

## 2020-10-20 MED ORDER — CHOLESTYRAMINE 4 G PO PACK: PACK | ORAL | 12 refills | Status: DC

## 2020-10-20 NOTE — Telephone Encounter (Signed)
Please inform patient the following information: Her pancreatic enzyme is in normal range. Her liver enzymes are in normal range. She is not a diabetic. Her calcium is mildly elevated, however this is due to dehydration.  Her stomach emptying process will return to normal when she refrains from use of alcohol.  She should look at it as: alcohol turns off the sphincter that allows stomach contents to empty into the colon.  Therefore, when she drinks alcohol the sphincter turns off, the stomach fills and without any capability of emptying into the colon-it has nowhere to go, so she vomits her stomach contacts.  I have also called in today a powder that is mixed with water to help decrease diarrhea.  She is to start this 1 packet a day and then increase to 2 packets a day next week.  She needs to pay attention to the instructions on the label-she has to take all of her medications at least an hour before drinking product or 6 hours after drinking the product.  Otherwise her medicines would not be absorbed correctly.  When she refrains from alcohol, her stomach returns to emptying normally and we slow down her diarrhea-then she will be able to absorb nutrients and water.  She needs to drink water and Gatorade zero (for electrolytes) at least 100 ounces a day, more if she is having diarrhea.  I would encourage a follow-up in 3-4 weeks so we can recheck how she is feeling.  Please schedule this for her.

## 2020-10-20 NOTE — Telephone Encounter (Signed)
Verified pt understanding  

## 2020-10-21 ENCOUNTER — Encounter: Payer: Self-pay | Admitting: Family Medicine

## 2020-11-03 ENCOUNTER — Encounter: Payer: Self-pay | Admitting: Family Medicine

## 2020-11-03 ENCOUNTER — Other Ambulatory Visit: Payer: Self-pay

## 2020-11-03 ENCOUNTER — Ambulatory Visit (INDEPENDENT_AMBULATORY_CARE_PROVIDER_SITE_OTHER): Payer: 59 | Admitting: Family Medicine

## 2020-11-03 VITALS — BP 137/84 | HR 82 | Temp 97.8°F | Ht 64.0 in | Wt 222.0 lb

## 2020-11-03 DIAGNOSIS — E559 Vitamin D deficiency, unspecified: Secondary | ICD-10-CM

## 2020-11-03 DIAGNOSIS — R002 Palpitations: Secondary | ICD-10-CM

## 2020-11-03 DIAGNOSIS — I10 Essential (primary) hypertension: Secondary | ICD-10-CM | POA: Diagnosis not present

## 2020-11-03 DIAGNOSIS — Z23 Encounter for immunization: Secondary | ICD-10-CM

## 2020-11-03 DIAGNOSIS — F1020 Alcohol dependence, uncomplicated: Secondary | ICD-10-CM

## 2020-11-03 DIAGNOSIS — F418 Other specified anxiety disorders: Secondary | ICD-10-CM

## 2020-11-03 MED ORDER — HEPATITIS B VAC RECOMBINANT 10 MCG/ML IJ SUSP
1.0000 mL | Freq: Once | INTRAMUSCULAR | Status: AC
  Administered 2020-11-03: 10 ug via INTRAMUSCULAR

## 2020-11-03 MED ORDER — GABAPENTIN 300 MG PO CAPS: ORAL_CAPSULE | ORAL | 5 refills | Status: DC

## 2020-11-03 NOTE — Progress Notes (Signed)
This visit occurred during the SARS-CoV-2 public health emergency.  Safety protocols were in place, including screening questions prior to the visit, additional usage of staff PPE, and extensive cleaning of exam room while observing appropriate contact time as indicated for disinfecting solutions.    Frances Powell , 09-Apr-1986, 34 y.o., female MRN: [id number] Patient Care Team    Relationship Specialty Notifications Start End  Natalia Leatherwood, DO PCP - General Family Medicine  09/24/19   Tarry Kos, MD Attending Physician Orthopedic Surgery  09/24/19     Chief Complaint  Patient presents with  . Follow-up    Pt sx has improved      Subjective: Pt presents for an OV for follow-up on her abdominal pain and alcohol abuse.  Patient reports her symptoms have greatly improved.  She is having no more abdominal pain nausea or vomit.  Her stools are more formed with the start of cholestyramine.  She currently is only taking cholestyramine once daily.  She has cut back her drinking to 3-4 times a week and only drinking a half of a pint [address].  She has restarted all of her medications and is reporting compliance with vitamin D supplementation vitamin B supplements, Seroquel, Effexor, Depakote, metoprolol, gabapentin, Questran and losartan.  No LMP recorded. (Menstrual status: Irregular Periods).   Depression screen [facility] 2/9 10/19/2020 11/25/2019 09/24/2019  Decreased Interest 3 0 3  Down, Depressed, Hopeless 3 1 2   PHQ - 2 Score 6 1 5   Altered sleeping 3 1 3   Tired, decreased energy 3 2 3   Change in appetite 0 1 1  Feeling bad or failure about yourself  3 0 3  Trouble concentrating 2 2 1   Moving slowly or fidgety/restless 3 0 2  Suicidal thoughts 1 0 0  PHQ-9 Score 21 7 18   Difficult doing work/chores - Very difficult Very difficult   GAD 7 : Generalized Anxiety Score 10/19/2020 11/25/2019 09/24/2019  Nervous, Anxious, on Edge 3 3 3   Control/stop worrying 3  3 3   Worry too much - different things 3 3 3   Trouble relaxing 3 3 3   Restless 0 1 2  Easily annoyed or irritable 3 1 3   Afraid - awful might happen 1 3 3   Total GAD 7 Score 16 17 20   Anxiety Difficulty - Very difficult -    No Known Allergies Social History   Social History Narrative   Marital status/children/pets: married   Education/employment: HS diploma- works as a .    Safety:      -smoke alarm in the home:Yes     - wears seatbelt: Yes     - Feels safe in their relationships: Yes   Past Medical History:  Diagnosis Date  . Alcohol abuse   . Depression   . Migraines    Past Surgical History:  Procedure Laterality Date  . APPENDECTOMY  2010   Family History  Problem Relation Age of Onset  . Arthritis Mother   . Depression Mother   . Lupus Mother   . Hypertension Mother   . Kidney disease Mother   . Miscarriages / Mother   . Leukemia Father   . Early death Father   . Kidney disease Maternal Grandmother   . Cancer Maternal Grandfather   . Heart attack Paternal Grandfather   . Early death Sister   . Heart disease Sister   . Cervical cancer Sister    Allergies as of 11/03/2020  No Known Allergies     Medication List       Accurate as of November 03, 2020  5:20 PM. If you have any questions, ask your nurse or doctor.        cholestyramine 4 g packet Commonly known as: Questran 2-3x daily with meals.All other  Medications must be taken at least 1 hour prior to use of product or 6 hours after product use.   gabapentin 300 MG capsule Commonly known as: NEURONTIN 300 mg in morning, 300 mg in the afternoon and 600 mg before bed What changed:   how much to take  how to take this  when to take this  additional instructions Changed by: Felix Pacini, DO   losartan 100 MG tablet Commonly known as: Cozaar Take 1 tablet (100 mg total) by mouth daily.   metoprolol succinate 25 MG 24 hr tablet Commonly known as: TOPROL-XL Take 1  tablet (25 mg total) by mouth daily.   naltrexone 50 MG tablet Commonly known as: DEPADE Take 1 tablet (50 mg total) by mouth daily.   QUEtiapine 50 MG tablet Commonly known as: SEROquel Take 1 tablet (50 mg total) by mouth at bedtime.   thiamine 100 MG tablet Take 1 tablet (100 mg total) by mouth daily.   venlafaxine XR 75 MG 24 hr capsule Commonly known as: Effexor XR Take 1 capsule (75 mg total) by mouth daily with breakfast.   vitamin B-12 1000 MCG tablet Commonly known as: CYANOCOBALAMIN Take 1 tablet (1,000 mcg total) by mouth daily.   Vitamin D (Cholecalciferol) 25 MCG (1000 UT) Tabs 1 tab daily, except for the day high-dose vitamin D tab is taken       All past medical history, surgical history, allergies, family history, immunizations andmedications were updated in the EMR today and reviewed under the history and medication portions of their EMR.     ROS: Negative, with the exception of above mentioned in HPI   Objective:  BP 137/84   Pulse 82   Temp 97.8 F (36.6 C) (Oral)   Ht 5\' 4"  (1.626 m)   Wt 222 lb (100.7 kg)   SpO2 98%   BMI 38.11 kg/m  Body mass index is 38.11 kg/m. Gen: Afebrile. No acute distress.  Nontoxic.  Appears well today. HENT: AT. [place].  Eyes:Pupils Equal Round Reactive to light, Extraocular movements intact,  Conjunctiva without redness, discharge or icterus. CV: RRR no murmur, no edema Chest: CTAB, no wheeze or crackles Abd: Soft. NTND. BS present Skin: No rashes, purpura or petechiae.  Neuro:  Normal gait. PERLA. EOMi. Alert. Oriented x3 Psych: Normal affect, dress and demeanor. Normal speech. Normal thought content and judgment   No exam data present No results found. No results found for this or any previous visit (from the past 24 hour(s)).  Assessment/Plan: Frances Powell is a 34 y.o. female present for OV for  Left upper quadrant abdominal pain Resolved with decrease in alcohol use.  Laboratory work-up was normal.  Likely gastroparesis for alcohol use.   Depression with anxiety Stable Continue Effexor 75 mg daily. Continue Seroquel 50 mg daily. Increase gabapentin 300/300/600  Essential hypertension Stable now that she is back on her medications continue metoprolol 25 mg daily Continue losartan to 100 mg daily  Alcohol dependence, daily use (HCC) Patient has cut back on her alcohol consumption and feeling much improved.  She has made a goal to cut back again over this next  coming week of no more than  1 drink on no more than 2 occasions.   Week 2: less than 1 drink a week At the end of week 2 she will discard all alcohol within the home.  She has the support of her wife for this and she also will not partake in alcohol use.  Quit date goal of 11/18/2021 Continue  Depade.   Gabapentin increased 300/300/600  Continue thiamine, B12 and vitamin D-labs due end of February/March Hepatitis B series initiated today.  Hepatitis B #2 in 4 weeks at follow-up appointment.  With hepatitis B #3 in May 2022. Follow-up in 4 weeks     Reviewed expectations re: course of current medical issues.  Discussed self-management of symptoms.  Outlined signs and symptoms indicating need for more acute intervention.  Patient verbalized understanding and all questions were answered.  Patient received an After-Visit Summary.    No orders of the defined types were placed in this encounter.  Meds ordered this encounter  Medications  . gabapentin (NEURONTIN) 300 MG capsule    Sig: 300 mg in morning, 300 mg in the afternoon and 600 mg before bed    Dispense:  120 capsule    Refill:  5    Dc prior script for gabapentin please  . hepatitis b vaccine for adults (RECOMBIVAX-HB) injection 10 mcg   Referral Orders  No referral(s) requested today     Note is dictated utilizing voice recognition software. Although note has been proof read prior to signing, occasional typographical errors still can be missed. If  any questions arise, please do not hesitate to call for verification.   electronically signed by:  Felix Pacini, DO  [facility] [place]

## 2020-11-03 NOTE — Patient Instructions (Addendum)
Week 1: 1 drink and no more than 2x week.  Week 2: 1 drink or less a week The day before Week 3: DUMP out all alcohol in the home. Do not buy more.    Increase gabapentin night dose > 300 mg in morning, 300 mg in the afternoon and 600 mg before bed. Rest of medications the same. I am proud of you! Keep up the good work.    Next appt  30 days we will completed your  hep B #2, and then #3 in 5-6 months.     Substance Use Disorder Substance use disorder occurs when a person's repeated use of drugs or alcohol interferes with his or her ability to be productive. This disorder can cause problems with mental and physical health. It can affect your ability to have healthy relationships, and it can keep you from being able to meet your responsibilities at work, home, or school. It can also lead to addiction, which is a condition in which the person cannot stop using the substance consistently for a period of time. Addiction changes the way the brain works. Because of these changes, addiction is a chronic condition. Substance use disorder can be mild, moderate, or severe. The most commonly abused substances include:  Alcohol.  Tobacco.  Marijuana.  Stimulants, such as cocaine and methamphetamine.  Hallucinogens, such as LSD and PCP.  Opioids, such as some prescription pain medicines and heroin. What are the causes? This condition may develop due to many complex social, psychological, or physical reasons, such as:  Stress.  Abuse.  Peer pressure.  Anxiety or depression. What increases the risk? This condition is more likely to develop in people who:  Use substances to cope with stress.  Have been abused.  Have a mental health disorder, such as depression.  Have a family history of substance use disorder. What are the signs or symptoms? Symptoms of this condition include:  Using the substance for longer periods of time or at a higher dosage than what is normal or  intended.  Having a lasting desire to use the substance.  Being unable to slow down or stop the use of the substance.  Spending an abnormal amount of time getting the substance, using the substance, or recovering from using the substance.  Using the substance in a way that interferes with work, school, social activities, and personal relationships.  Using the substance even after having negative consequences, such as: ? Health problems. ? Legal or financial troubles. ? Job loss. ? Relationship problems.  Needing more and more of the substance to get the same effect (developing tolerance).  Experiencing unpleasant symptoms if you do not use the substance (withdrawal).  Using the substance to avoid withdrawal symptoms. How is this diagnosed? This condition may be diagnosed based on:  A physical exam.  Your history of substance use.  Your symptoms. This includes: ? How substance use affects your life. ? Changes in personality, behaviors, and mood. ? Having at least two symptoms of substance use disorder within a 11-month period. ? Health issues related to substance use, such as liver damage, shortness of breath, fatigue, cough, or heart problems.  Blood or urine tests to screen for alcohol and drugs. How is this treated? This condition may be treated by:  Stopping substance use safely. This may require taking medicines and being closely monitored for several days.  Taking part in group and individual counseling from mental health providers who help people with substance use disorder.  Staying  at a live-in (residential) treatment center for several days or weeks.  Attending daily counseling sessions at a treatment center.  Taking medicine as told by your health care provider: ? To ease symptoms and prevent complications during withdrawal. ? To treat other mental health issues, such as depression or anxiety. ? To block cravings by causing the same effects as the  substance. ? To block the effects of the substance or replace good sensations with unpleasant ones.  Participating in a support group to share your experience with others who are going through the same thing. These groups are an important part of long-term recovery for many people. Recovery can be a long process. Many people who undergo treatment start using the substance again after stopping (relapse). If you relapse, that does not mean that treatment will not work. Follow these instructions at home:   Take over-the-counter and prescription medicines only as told by your health care provider.  Do not use any drugs or alcohol.  Avoid temptations or triggers that you associate with your use of the substance.  Learn and practice techniques for managing stress.  Have a plan for vulnerable moments. Get phone numbers of people who are willing to help and who are committed to your recovery.  Attend support groups on a regular basis. These groups include 12-step programs like Alcoholics Anonymous and Narcotics Anonymous.  Keep all follow-up visits as told by your health care providers. This is important. This includes continuing to work with therapists and support groups. Contact a health care provider if:  You cannot take your medicines as told.  Your symptoms get worse.  You have trouble resisting the urge to use drugs or alcohol. Get help right away if you:  Relapse.  Think that you may have taken too much of a drug. The hotline of the [facility] is [phone].  Have signs of an overdose. Symptoms include: ? Chest pain. ? Confusion. ? Sleepiness or difficulty staying awake. ? Slowed breathing. ? Nausea or vomiting. ? A seizure.  Have serious thoughts about hurting yourself or someone else. Drug overdose is an emergency. Do not wait to see if the symptoms will go away. Get medical help right away. Call your local emergency services (911 in the [place] Do  not drive yourself to the hospital. If you ever feel like you may hurt yourself or others, or have thoughts about taking your own life, get help right away. You can go to your nearest emergency department or call:  Your local emergency services (911 in the [place]  A suicide crisis helpline, such as the National Suicide Prevention Lifeline at 586-206-2942. This is open 24 hours a day. Summary  Substance use disorder occurs when a person's repeated use of drugs or alcohol interferes with his or her ability to be productive.  Taking part in group and individual counseling from mental health providers is a common treatment for people with substance use disorder.  Recovery can be a long process. Many people who undergo treatment start using the substance again after stopping (relapse). A relapse does not mean that treatment will not work.  Attend support groups such as Alcoholics Anonymous and Narcotics Anonymous. These groups are an important part of long-term recovery for many people. This information is not intended to replace advice given to you by your health care provider. Make sure you discuss any questions you have with your health care provider. Document Revised: 03/13/2019 Document Reviewed: 01/01/2018 Elsevier Patient Education  [phone]  [employer].

## 2020-12-02 ENCOUNTER — Ambulatory Visit: Payer: 59 | Admitting: Family Medicine

## 2020-12-02 ENCOUNTER — Ambulatory Visit: Payer: 59

## 2020-12-07 ENCOUNTER — Ambulatory Visit: Payer: 59 | Admitting: Family Medicine

## 2021-05-04 ENCOUNTER — Encounter (HOSPITAL_BASED_OUTPATIENT_CLINIC_OR_DEPARTMENT_OTHER): Payer: Self-pay | Admitting: *Deleted

## 2021-05-04 ENCOUNTER — Other Ambulatory Visit: Payer: Self-pay

## 2021-05-04 ENCOUNTER — Emergency Department (HOSPITAL_BASED_OUTPATIENT_CLINIC_OR_DEPARTMENT_OTHER)
Admission: EM | Admit: 2021-05-04 | Discharge: 2021-05-04 | Disposition: A | Discharge status: Home / Self Care | Payer: 59 | Attending: Emergency Medicine | Admitting: Emergency Medicine

## 2021-05-04 ENCOUNTER — Emergency Department (HOSPITAL_BASED_OUTPATIENT_CLINIC_OR_DEPARTMENT_OTHER): Payer: 59

## 2021-05-04 DIAGNOSIS — S92515A Nondisplaced fracture of proximal phalanx of left lesser toe(s), initial encounter for closed fracture: Secondary | ICD-10-CM | POA: Diagnosis not present

## 2021-05-04 DIAGNOSIS — F17210 Nicotine dependence, cigarettes, uncomplicated: Secondary | ICD-10-CM | POA: Insufficient documentation

## 2021-05-04 DIAGNOSIS — W228XXA Striking against or struck by other objects, initial encounter: Secondary | ICD-10-CM | POA: Diagnosis not present

## 2021-05-04 DIAGNOSIS — S92505A Nondisplaced unspecified fracture of left lesser toe(s), initial encounter for closed fracture: Secondary | ICD-10-CM

## 2021-05-04 DIAGNOSIS — Y9301 Activity, walking, marching and hiking: Secondary | ICD-10-CM | POA: Diagnosis not present

## 2021-05-04 DIAGNOSIS — Y92019 Unspecified place in single-family (private) house as the place of occurrence of the external cause: Secondary | ICD-10-CM | POA: Insufficient documentation

## 2021-05-04 DIAGNOSIS — I10 Essential (primary) hypertension: Secondary | ICD-10-CM | POA: Insufficient documentation

## 2021-05-04 DIAGNOSIS — Z79899 Other long term (current) drug therapy: Secondary | ICD-10-CM | POA: Insufficient documentation

## 2021-05-04 DIAGNOSIS — S99922A Unspecified injury of left foot, initial encounter: Secondary | ICD-10-CM | POA: Diagnosis present

## 2021-05-04 MED ORDER — IBUPROFEN 800 MG PO TABS
800.0000 mg | ORAL_TABLET | Freq: Once | ORAL | Status: AC
  Administered 2021-05-04: 800 mg via ORAL
  Filled 2021-05-04: qty 1

## 2021-05-04 NOTE — ED Triage Notes (Signed)
C/o left 5th toe injury x 1 day ago

## 2021-05-04 NOTE — Discharge Instructions (Signed)
You came to the [facility] department today to be evaluated for your left pinky toe pain.  The x-ray obtained showed that you have a broken bone in this toe.  These wear postop shoe whenever standing or ambulating.  Please follow-up with orthopedic provider for reassessment.  Please take Ibuprofen (Advil, motrin) and Tylenol (acetaminophen) to relieve your pain.    You may take up to 600 MG (3 pills) of normal strength ibuprofen every 8 hours as needed.   You make take tylenol, up to 1,000 mg (two extra strength pills) every 8 hours as needed.   It is safe to take ibuprofen and tylenol at the same time as they work differently.   Do not take more than 3,000 mg tylenol in a 24 hour period (not more than one dose every 8 hours.  Please check all medication labels as many medications such as pain and cold medications may contain tylenol.  Do not drink alcohol while taking these medications.  Do not take other NSAID'S while taking ibuprofen (such as aleve or naproxen).  Please take ibuprofen with food to decrease stomach upset.  Get help right away if you have: Any of the following in your toes or your foot: Numbness that gets worse. Tingling. Coldness. Blue skin. Redness or swelling that gets worse. Pain that suddenly becomes severe.

## 2021-05-04 NOTE — ED Provider Notes (Signed)
[facility] EMERGENCY DEPARTMENT Provider Note   CSN: [id number] Arrival date & time: 05/04/21  1531     History Chief Complaint  Patient presents with  . Toe Injury    Left 5th    Frances Powell is a 35 y.o. female with a chief complaint left fifth toe injury.  Patient reports that today at approximately 1800 she was walking in her home when she hit her toe on a storm door.  Patient complains of pain to left fifth toe.  Pain has gotten progressively better since injury.  At present patient rates pain 7/10 on the pain scale.  Pain is worse with weightbearing or touch.  Patient has had improvement in her symptoms with elevation and ice.  Patient has not tried over-the-counter pain medication.  Endorses associated bruising and swelling to left fifth toe and foot.  Patient endorses decrease sensation to left fifth toe.  Patient denies any weakness.  Patient denies falling or any other injuries.  Patient is not on any blood thinners.   HPI     Past Medical History:  Diagnosis Date  . Alcohol abuse   . Depression   . Migraines     Patient Active Problem List   Diagnosis Date Noted  . 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 Alcohol dependence, daily use (HCC) 05/25/2020  . Elevated LFTs 05/25/2020  . 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 Vitamin D deficiency 05/20/2020  . Malabsorption syndrome 05/20/2020  . Tobacco use disorder 11/07/2019  . Depression with anxiety 11/07/2019  . Essential hypertension 11/07/2019  . Palpitation 11/07/2019  . Obesity (BMI 30-39.9) 09/25/2019    Past Surgical History:  Procedure Laterality Date  . APPENDECTOMY  2010     OB History    Gravida  2   Para  0   Term      Preterm      AB  2   Living  0     SAB      IAB      Ectopic      Multiple      Live Births              Family History  Problem Relation Age of Onset  . Arthritis Mother   . Depression Mother   . Lupus Mother   . Hypertension  Mother   . Kidney disease Mother   . Miscarriages / [place] Mother   . Leukemia Father   . Early death Father   . Kidney disease Maternal Grandmother   . Cancer Maternal Grandfather   . Heart attack Paternal Grandfather   . Early death Sister   . Heart disease Sister   . Cervical cancer Sister     Social History   Tobacco Use  . Smoking status: Current Every Day Smoker    Packs/day: 1.00    Years: 14.00    Pack years: 14.00    Types: Cigarettes  . Smokeless tobacco: Never Used  Vaping Use  . Vaping Use: Never used  Substance Use Topics  . Alcohol use: Yes    Alcohol/week: 2.0 standard drinks    Types: 2 Standard drinks or equivalent per week  . Drug use: Yes    Frequency: 2.0 times per week    Types: Marijuana    Home Medications Prior to Admission medications   Medication Sig Start Date End Date Taking? Authorizing Provider  cholestyramine (QUESTRAN) 4 g packet 2-3x daily with meals.All other  Medications must be  taken at least 1 hour prior to use of product or 6 hours after product use. 10/20/20   Kuneff, Renee A, DO  gabapentin (NEURONTIN) 300 MG capsule 300 mg in morning, 300 mg in the afternoon and 600 mg before bed 11/03/20   Kuneff, Renee A, DO  losartan (COZAAR) 100 MG tablet Take 1 tablet (100 mg total) by mouth daily. 10/19/20   Kuneff, Renee A, DO  metoprolol succinate (TOPROL-XL) 25 MG 24 hr tablet Take 1 tablet (25 mg total) by mouth daily. 10/19/20   Kuneff, Renee A, DO  naltrexone (DEPADE) 50 MG tablet Take 1 tablet (50 mg total) by mouth daily. 10/19/20   Kuneff, Renee A, DO  QUEtiapine (SEROQUEL) 50 MG tablet Take 1 tablet (50 mg total) by mouth at bedtime. 10/19/20   Kuneff, Renee A, DO  thiamine 100 MG tablet Take 1 tablet (100 mg total) by mouth daily. 05/20/20   Kuneff, Renee A, DO  venlafaxine XR (EFFEXOR XR) 75 MG 24 hr capsule Take 1 capsule (75 mg total) by mouth daily with breakfast. 10/19/20   Kuneff, Renee A, DO  vitamin B-12 (CYANOCOBALAMIN)  1000 MCG tablet Take 1 tablet (1,000 mcg total) by mouth daily. 05/20/20   Kuneff, Renee A, DO  Vitamin D, Cholecalciferol, 25 MCG (1000 UT) TABS 1 tab daily, except for the day high-dose vitamin D tab is taken 05/20/20   Claiborne Billings, Renee A, DO    Allergies    Patient has no known allergies.  Review of Systems   Review of Systems  Musculoskeletal: Positive for arthralgias, joint swelling and myalgias. Negative for back pain and neck pain.  Skin: Negative for color change, pallor, rash and wound.  Neurological: Positive for numbness. Negative for weakness.    Physical Exam Updated Vital Signs BP (!) 155/106   Pulse 86   Temp 98.4 F (36.9 C) (Oral)   Resp 16   Ht 5\' 5"  (1.651 m)   Wt 97.5 kg   SpO2 99%   BMI 35.78 kg/m   Physical Exam Vitals and nursing note reviewed.  Constitutional:      General: She is not in acute distress.    Appearance: She is not ill-appearing, toxic-appearing or diaphoretic.  HENT:     Head: Normocephalic.  Eyes:     General: No scleral icterus.       Right eye: No discharge.        Left eye: No discharge.  Cardiovascular:     Rate and Rhythm: Normal rate.     Pulses:          Dorsalis pedis pulses are 3+ on the right side and 3+ on the left side.  Pulmonary:     Effort: Pulmonary effort is normal.  Musculoskeletal:     Right lower leg: Normal.     Left lower leg: Normal.     Right ankle: No swelling, deformity, ecchymosis or lacerations. No tenderness. Normal range of motion. Normal pulse.     Left ankle: No swelling, deformity, ecchymosis or lacerations. No tenderness. Normal range of motion. Normal pulse.     Right foot: Normal range of motion and normal capillary refill. No swelling, deformity, laceration, tenderness or bony tenderness.     Left foot: Normal range of motion and normal capillary refill. Swelling, tenderness (left fifth digit) and bony tenderness present. No deformity or laceration.     Comments: Patient has bruising to left  fifth digit and lateral aspect of left foot.  Patient has  sensation intact to all digits of left foot.  Cap refill less than 2 seconds in all digits of left foot.  Patient has flexion and extension of all digits of the left foot.  Feet:     Right foot:     Skin integrity: Skin integrity normal.     Toenail Condition: Right toenails are normal.     Left foot:     Skin integrity: Skin integrity normal.     Toenail Condition: Left toenails are normal.  Skin:    General: Skin is warm and dry.  Neurological:     General: No focal deficit present.     Mental Status: She is alert.     GCS: GCS eye subscore is 4. GCS verbal subscore is 5. GCS motor subscore is 6.  Psychiatric:        Behavior: Behavior is cooperative.     ED Results / Procedures / Treatments   Labs (all labs ordered are listed, but only abnormal results are displayed) Labs Reviewed - No data to display  EKG None  Radiology DG Foot Complete Left  Result Date: 05/04/2021 CLINICAL DATA:  Blunt trauma to the fifth digit, initial encounter EXAM: LEFT FOOT - COMPLETE 3+ VIEW COMPARISON:  None. FINDINGS: Oblique fracture through the fifth proximal phalanx is noted without significant displacement. No soft tissue abnormality is seen. No other bony abnormality is noted. IMPRESSION: Fifth proximal phalangeal fracture without significant displacement. Electronically Signed   By: Alcide Clever M.D.   On: 05/04/2021 17:11    Procedures Procedures   Medications Ordered in ED Medications  ibuprofen (ADVIL) tablet 800 mg (800 mg Oral Given 05/04/21 1631)    ED Course  I have reviewed the triage vital signs and the nursing notes.  Pertinent labs & imaging results that were available during my care of the patient were reviewed by me and considered in my medical decision making (see chart for details).    MDM Rules/Calculators/A&P                          Alert 35 year old female no acute distress, nontoxic-appearing.  Patient  presents with chief complaint of left fifth toe injury.  Patient injured herself yesterday evening.  Patient complains of pain, bruising, swelling, and decrease in station to left fifth toe.  Will obtain x-ray imaging to evaluate for fracture or dislocation.  Patient given ibuprofen for pain management.  Imaging of left foot shows fifth proximal phalangeal fracture without significant displacement.  No soft tissue abnormality is seen.  No other bony abnormality is noted.  Will place patient in postop shoe.  We will have patient follow-up with orthopedic provider in 1 week for reevaluation.  Patient given information for over-the-counter pain medication use.  Patient given strict return precautions.  Patient expressed understanding of all structures and is agreeable with plan.   Final Clinical Impression(s) / ED Diagnoses Final diagnoses:  Closed nondisplaced fracture of phalanx of lesser toe of left foot, unspecified phalanx, initial encounter    Rx / DC Orders ED Discharge Orders    None       Berneice Heinrich 05/04/21 1728    Jacalyn Lefevre, MD 05/04/21 [phone]

## 2021-05-11 ENCOUNTER — Ambulatory Visit (INDEPENDENT_AMBULATORY_CARE_PROVIDER_SITE_OTHER): Payer: 59 | Admitting: Orthopaedic Surgery

## 2021-05-11 ENCOUNTER — Other Ambulatory Visit: Payer: Self-pay

## 2021-05-11 ENCOUNTER — Encounter: Payer: Self-pay | Admitting: Orthopaedic Surgery

## 2021-05-11 DIAGNOSIS — S92512A Displaced fracture of proximal phalanx of left lesser toe(s), initial encounter for closed fracture: Secondary | ICD-10-CM

## 2021-05-11 DIAGNOSIS — S92919A Unspecified fracture of unspecified toe(s), initial encounter for closed fracture: Secondary | ICD-10-CM | POA: Insufficient documentation

## 2021-05-11 HISTORY — DX: Unspecified fracture of unspecified toe(s), initial encounter for closed fracture: S92.919A

## 2021-05-11 NOTE — Progress Notes (Signed)
Office Visit Note   Patient: Frances Powell           Date of Birth: May 21, 1986           MRN: [id number] Visit Date: 05/11/2021              Requested by: Natalia Leatherwood, DO [address] [place],  [place] [address] PCP: Natalia Leatherwood, DO   Assessment & Plan: Visit Diagnoses:  1. Closed displaced fracture of proximal phalanx of lesser toe of left foot, initial encounter     Plan: Continue elevation ibuprofen.  She will call if she needs a work note.  She will continue with postop shoe until she is able to get normal shoe wear on.  X-rays were reviewed with patient and outlined treatment plan.  He  Follow-Up Instructions: No follow-ups on file.   Orders:  No orders of the defined types were placed in this encounter.  No orders of the defined types were placed in this encounter.     Procedures: No procedures performed   Clinical Data: No additional findings.   Subjective: Chief Complaint  Patient presents with  . Left Foot - Fracture    Left 5th toe fracture DOI 05/04/2021    HPI 35 year old female her left fifth toe on the edge of a storm door that was sticking up with medial pain and deformity right fifth toe on 05/04/2021.  She states she pushed it over.  She had x-rays obtained on PACS which are available for review.  Review of Systems noncontributory.   Objective: Vital Signs: BP (!) 141/83   Pulse 79   Ht 5\' 5"  (1.651 m)   Wt 215 lb (97.5 kg)   BMI 35.78 kg/m   Physical Exam Constitutional:      Appearance: She is well-developed.  HENT:     Head: Normocephalic.     Right Ear: External ear normal.     Left Ear: External ear normal.  Eyes:     Pupils: Pupils are equal, round, and reactive to light.  Neck:     Thyroid: No thyromegaly.     Trachea: No tracheal deviation.  Cardiovascular:     Rate and Rhythm: Normal rate.  Pulmonary:     Effort: Pulmonary effort is normal.  Abdominal:     Palpations: Abdomen is soft.  Skin:    General: Skin  is warm and dry.  Neurological:     Mental Status: She is alert and oriented to person, place, and time.  Psychiatric:        Behavior: Behavior normal.     Ortho Exam mild ecchymosis base of the toe, left fifth toe.  No rotation or varus or valgus deformity.  Patient is using a postop shoe.  Specialty Comments:  No specialty comments available.  Imaging: CLINICAL DATA:  Blunt trauma to the fifth digit, initial encounter  EXAM: LEFT FOOT - COMPLETE 3+ VIEW  COMPARISON:  None.  FINDINGS: Oblique fracture through the fifth proximal phalanx is noted without significant displacement. No soft tissue abnormality is seen. No other bony abnormality is noted.  IMPRESSION: Fifth proximal phalangeal fracture without significant displacement.   Electronically Signed   By: M.D.   On: 05/04/2021 17:11   PMFS History: Patient Active Problem List   Diagnosis Date Noted  . Toe fracture 05/11/2021  . 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 Alcohol dependence, daily use (HCC) 05/25/2020  . Elevated LFTs 05/25/2020  .  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 Vitamin D deficiency 05/20/2020  . Malabsorption syndrome 05/20/2020  . Tobacco use disorder 11/07/2019  . Depression with anxiety 11/07/2019  . Essential hypertension 11/07/2019  . Palpitation 11/07/2019  . Obesity (BMI 30-39.9) 09/25/2019   Past Medical History:  Diagnosis Date  . Alcohol abuse   . Depression   . Migraines     Family History  Problem Relation Age of Onset  . Arthritis Mother   . Depression Mother   . Lupus Mother   . Hypertension Mother   . Kidney disease Mother   . Miscarriages / [place] Mother   . Leukemia Father   . Early death Father   . Kidney disease Maternal Grandmother   . Cancer Maternal Grandfather   . Heart attack Paternal Grandfather   . Early death Sister   . Heart disease Sister   . Cervical cancer Sister     Past Surgical History:  Procedure  Laterality Date  . APPENDECTOMY  2010   Social History   Occupational History  . Not on file  Tobacco Use  . Smoking status: Current Every Day Smoker    Packs/day: 1.00    Years: 14.00    Pack years: 14.00    Types: Cigarettes  . Smokeless tobacco: Never Used  Vaping Use  . Vaping Use: Never used  Substance and Sexual Activity  . Alcohol use: Yes    Alcohol/week: 2.0 standard drinks    Types: 2 Standard drinks or equivalent per week  . Drug use: Yes    Frequency: 2.0 times per week    Types: Marijuana  . Sexual activity: Yes    Partners: Female

## 2021-08-24 ENCOUNTER — Encounter: Payer: Self-pay | Admitting: Family Medicine

## 2021-08-24 ENCOUNTER — Other Ambulatory Visit: Payer: Self-pay

## 2021-08-24 ENCOUNTER — Ambulatory Visit (INDEPENDENT_AMBULATORY_CARE_PROVIDER_SITE_OTHER): Payer: 59 | Admitting: Family Medicine

## 2021-08-24 VITALS — BP 136/90 | HR 82 | Temp 97.7°F | Ht 65.0 in | Wt 210.0 lb

## 2021-08-24 DIAGNOSIS — I10 Essential (primary) hypertension: Secondary | ICD-10-CM | POA: Diagnosis not present

## 2021-08-24 DIAGNOSIS — L02419 Cutaneous abscess of limb, unspecified: Secondary | ICD-10-CM | POA: Diagnosis not present

## 2021-08-24 DIAGNOSIS — Z2839 Other underimmunization status: Secondary | ICD-10-CM

## 2021-08-24 DIAGNOSIS — Z789 Other specified health status: Secondary | ICD-10-CM | POA: Diagnosis not present

## 2021-08-24 HISTORY — DX: Cutaneous abscess of limb, unspecified: L02.419

## 2021-08-24 MED ORDER — METOPROLOL SUCCINATE ER 25 MG PO TB24: 25.0000 mg | ORAL_TABLET | Freq: Every day | ORAL | 0 refills | Status: DC

## 2021-08-24 MED ORDER — LOSARTAN POTASSIUM 100 MG PO TABS: 100.0000 mg | ORAL_TABLET | Freq: Every day | ORAL | 0 refills | Status: DC

## 2021-08-24 MED ORDER — SULFAMETHOXAZOLE-TRIMETHOPRIM 800-160 MG PO TABS: 1.0000 | ORAL_TABLET | Freq: Two times a day (BID) | ORAL | 0 refills | Status: DC

## 2021-08-24 NOTE — Patient Instructions (Signed)
Follow up in 2 weeks on chronic conditions and recheck.   Skin Abscess A skin abscess is an infected area on or under your skin that contains a collection of pus and other material. An abscess may also be called a furuncle, carbuncle, or boil. An abscess can occur in or on almost any part of your body. Some abscesses break open (rupture) on their own. Most continue to get worse unless they are treated. The infection can spread deeper into the body and eventually into your blood, which can make you feel ill. Treatment usually involves draining the abscess. What are the causes? An abscess occurs when germs, like bacteria, pass through your skin and cause an infection. This may be caused by: A scrape or cut on your skin. A puncture wound through your skin, including a needle injection or insect bite. Blocked oil or sweat glands. Blocked and infected hair follicles. A cyst that forms beneath your skin (sebaceous cyst) and becomes infected. What increases the risk? This condition is more likely to develop in people who: Have a weak body defense system (immune system). Have diabetes. Have dry and irritated skin. Get frequent injections or use illegal IV drugs. Have a foreign body in a wound, such as a splinter. Have problems with their lymph system or veins. What are the signs or symptoms? Symptoms of this condition include: A painful, firm bump under the skin. A bump with pus at the top. This may break through the skin and drain. Other symptoms include: Redness surrounding the abscess site. Warmth. Swelling of the lymph nodes (glands) near the abscess. Tenderness. A sore on the skin. How is this diagnosed? This condition may be diagnosed based on: A physical exam. Your medical history. A sample of pus. This may be used to find out what is causing the infection. Blood tests. Imaging tests, such as an ultrasound, CT scan, or MRI. How is this treated? A small abscess that drains on  its own may not need treatment. Treatment for larger abscesses may include: Moist heat or heat pack applied to the area several times a day. A procedure to drain the abscess (incision and drainage). Antibiotic medicines. For a severe abscess, you may first get antibiotics through an IV and then change to antibiotics by mouth. Follow these instructions at home: Medicines  Take over-the-counter and prescription medicines only as told by your health care provider. If you were prescribed an antibiotic medicine, take it as told by your health care provider. Do not stop taking the antibiotic even if you start to feel better. Abscess care  If you have an abscess that has not drained, apply heat to the affected area. Use the heat source that your health care provider recommends, such as a moist heat pack or a heating pad. Place a towel between your skin and the heat source. Leave the heat on for 20-30 minutes. Remove the heat if your skin turns bright red. This is especially important if you are unable to feel pain, heat, or cold. You may have a greater risk of getting burned. Follow instructions from your health care provider about how to take care of your abscess. Make sure you: Cover the abscess with a bandage (dressing). Change your dressing or gauze as told by your health care provider. Wash your hands with soap and water before you change the dressing or gauze. If soap and water are not available, use hand sanitizer. Check your abscess every day for signs of a worsening infection. Check  for: More redness, swelling, or pain. More fluid or blood. Warmth. More pus or a bad smell. General instructions To avoid spreading the infection: Do not share personal care items, towels, or hot tubs with others. Avoid making skin contact with other people. Keep all follow-up visits as told by your health care provider. This is important. Contact a health care provider if you have: More redness, swelling,  or pain around your abscess. More fluid or blood coming from your abscess. Warm skin around your abscess. More pus or a bad smell coming from your abscess. A fever. Muscle aches. Chills or a general ill feeling. Get help right away if you: Have severe pain. See red streaks on your skin spreading away from the abscess. Summary A skin abscess is an infected area on or under your skin that contains a collection of pus and other material. A small abscess that drains on its own may not need treatment. Treatment for larger abscesses may include having a procedure to drain the abscess and taking an antibiotic. This information is not intended to replace advice given to you by your health care provider. Make sure you discuss any questions you have with your health care provider. Document Revised: 03/13/2019 Document Reviewed: 01/03/2018 Elsevier Patient Education  2022 [employer].

## 2021-08-24 NOTE — Progress Notes (Signed)
This visit occurred during the SARS-CoV-2 public health emergency.  Safety protocols were in place, including screening questions prior to the visit, additional usage of staff PPE, and extensive cleaning of exam room while observing appropriate contact time as indicated for disinfecting solutions.    Frances Powell , 1985/12/22, 35 y.o., female MRN: [id number] Patient Care Team    Relationship Specialty Notifications Start End  Natalia Leatherwood, DO PCP - General Family Medicine  09/24/19   Tarry Kos, MD Attending Physician Orthopedic Surgery  09/24/19     Chief Complaint  Patient presents with   Cyst    Pt reports swelling in axilla x 2 weeks; pt c/o pain that radiates to hand causing trouble with gripping items     Subjective: Pt presents for an OV with complaints of left axillary discomfort of 2 weeks duration. She denies fever, redness, open wound or drainage. She feels a bump in her arm pit and its is tender to touch.   Depression screen [facility] 2/9 10/19/2020 11/25/2019 09/24/2019  Decreased Interest 3 0 3  Down, Depressed, Hopeless 3 1 2   PHQ - 2 Score 6 1 5   Altered sleeping 3 1 3   Tired, decreased energy 3 2 3   Change in appetite 0 1 1  Feeling bad or failure about yourself  3 0 3  Trouble concentrating 2 2 1   Moving slowly or fidgety/restless 3 0 2  Suicidal thoughts 1 0 0  PHQ-9 Score 21 7 18   Difficult doing work/chores - Very difficult Very difficult    No Known Allergies Social History   Social History Narrative   Marital status/children/pets: married   Education/employment: HS diploma- works as a .    Safety:      -smoke alarm in the home:Yes     - wears seatbelt: Yes     - Feels safe in their relationships: Yes   Past Medical History:  Diagnosis Date   Alcohol abuse    Depression    Migraines    Toe fracture 05/11/2021   Past Surgical History:  Procedure Laterality Date   APPENDECTOMY  2010   Family History  Problem Relation Age  of Onset   Arthritis Mother    Depression Mother    Lupus Mother    Hypertension Mother    Kidney disease Mother    Miscarriages / Mother    Leukemia Father    Early death Father    Kidney disease Maternal Grandmother    Cancer Maternal Grandfather    Heart attack Paternal Grandfather    Early death Sister    Heart disease Sister    Cervical cancer Sister    Allergies as of 08/24/2021   No Known Allergies      Medication List        Accurate as of August 24, 2021  8:28 AM. If you have any questions, ask your nurse or doctor.          cholestyramine 4 g packet Commonly known as: Questran 2-3x daily with meals.All other  Medications must be taken at least 1 hour prior to use of product or 6 hours after product use.   gabapentin 300 MG capsule Commonly known as: NEURONTIN 300 mg in morning, 300 mg in the afternoon and 600 mg before bed   losartan 100 MG tablet Commonly known as: Cozaar Take 1 tablet (100 mg total) by mouth daily.   metoprolol succinate 25 MG 24 hr tablet Commonly known  as: TOPROL-XL Take 1 tablet (25 mg total) by mouth daily.   naltrexone 50 MG tablet Commonly known as: DEPADE Take 1 tablet (50 mg total) by mouth daily.   QUEtiapine 50 MG tablet Commonly known as: SEROquel Take 1 tablet (50 mg total) by mouth at bedtime.   sulfamethoxazole-trimethoprim 800-160 MG tablet Commonly known as: BACTRIM DS Take 1 tablet by mouth 2 (two) times daily. Started by: Felix Pacini, DO   thiamine 100 MG tablet Take 1 tablet (100 mg total) by mouth daily.   venlafaxine XR 75 MG 24 hr capsule Commonly known as: Effexor XR Take 1 capsule (75 mg total) by mouth daily with breakfast.   vitamin B-12 1000 MCG tablet Commonly known as: CYANOCOBALAMIN Take 1 tablet (1,000 mcg total) by mouth daily.   Vitamin D (Cholecalciferol) 25 MCG (1000 UT) Tabs 1 tab daily, except for the day high-dose vitamin D tab is taken        All past  medical history, surgical history, allergies, family history, immunizations andmedications were updated in the EMR today and reviewed under the history and medication portions of their EMR.     ROS: Negative, with the exception of above mentioned in HPI   Objective:  BP 136/90   Pulse 82   Temp 97.7 F (36.5 C) (Oral)   Ht 5\' 5"  (1.651 m)   Wt 210 lb (95.3 kg)   SpO2 99%   BMI 34.95 kg/m  Body mass index is 34.95 kg/m. Gen: Afebrile. No acute distress. Nontoxic in appearance, well developed, well nourished.  HENT: AT. [place].  Eyes:Pupils Equal Round Reactive to light, Extraocular movements intact,  Conjunctiva without redness, discharge or icterus. Skin: no rashes, purpura or petechiae.  Neuro:Normal gait. PERLA. EOMi. Alert. Oriented x3 Psych: Normal affect, dress and demeanor. Normal speech. Normal thought content and judgment.  No results found. No results found. No results found for this or any previous visit (from the past 24 hour(s)).  Assessment/Plan: Frances Powell is a 35 y.o. female present for OV for  Axillary abscess Small axillary abscess present, has yet to surface.  Warm compresses recommended.  Bactrim BID Avoidance of alcohol with abx.  Avoidance of shaving until infection cleared.  F/u 2 weeks for recheck and [facility]  Hypertension: Filled losartan and metoprolol for her #30d to bridge until follow up  Hep B vac: She received only one dose of hep B.  Will provide catch up schedule on follow up.    Reviewed expectations re: course of current medical issues. Discussed self-management of symptoms. Outlined signs and symptoms indicating need for more acute intervention. Patient verbalized understanding and all questions were answered. Patient received an After-Visit Summary.    No orders of the defined types were placed in this encounter.  Meds ordered this encounter  Medications   sulfamethoxazole-trimethoprim (BACTRIM DS) 800-160 MG tablet    Sig:  Take 1 tablet by mouth 2 (two) times daily.    Dispense:  14 tablet    Refill:  0   losartan (COZAAR) 100 MG tablet    Sig: Take 1 tablet (100 mg total) by mouth daily.    Dispense:  30 tablet    Refill:  0    DC prior doses- change in therapy.   metoprolol succinate (TOPROL-XL) 25 MG 24 hr tablet    Sig: Take 1 tablet (25 mg total) by mouth daily.    Dispense:  30 tablet    Refill:  0   Referral Orders  No referral(s) requested today     Note is dictated utilizing voice recognition software. Although note has been proof read prior to signing, occasional typographical errors still can be missed. If any questions arise, please do not hesitate to call for verification.   electronically signed by:  Howard Pouch, DO  [place]

## 2021-09-08 ENCOUNTER — Encounter: Payer: Self-pay | Admitting: Family Medicine

## 2021-09-08 ENCOUNTER — Other Ambulatory Visit: Payer: Self-pay

## 2021-09-08 ENCOUNTER — Ambulatory Visit: Payer: 59 | Admitting: Family Medicine

## 2021-09-08 VITALS — BP 125/84 | HR 82 | Temp 97.9°F | Ht 65.0 in | Wt 204.0 lb

## 2021-09-08 DIAGNOSIS — Z23 Encounter for immunization: Secondary | ICD-10-CM

## 2021-09-08 DIAGNOSIS — R7309 Other abnormal glucose: Secondary | ICD-10-CM

## 2021-09-08 DIAGNOSIS — F330 Major depressive disorder, recurrent, mild: Secondary | ICD-10-CM

## 2021-09-08 DIAGNOSIS — R7989 Other specified abnormal findings of blood chemistry: Secondary | ICD-10-CM

## 2021-09-08 DIAGNOSIS — E559 Vitamin D deficiency, unspecified: Secondary | ICD-10-CM | POA: Diagnosis not present

## 2021-09-08 DIAGNOSIS — I10 Essential (primary) hypertension: Secondary | ICD-10-CM

## 2021-09-08 DIAGNOSIS — K909 Intestinal malabsorption, unspecified: Secondary | ICD-10-CM

## 2021-09-08 DIAGNOSIS — R932 Abnormal findings on diagnostic imaging of liver and biliary tract: Secondary | ICD-10-CM

## 2021-09-08 DIAGNOSIS — F17200 Nicotine dependence, unspecified, uncomplicated: Secondary | ICD-10-CM

## 2021-09-08 DIAGNOSIS — K591 Functional diarrhea: Secondary | ICD-10-CM

## 2021-09-08 DIAGNOSIS — F1020 Alcohol dependence, uncomplicated: Secondary | ICD-10-CM

## 2021-09-08 DIAGNOSIS — E669 Obesity, unspecified: Secondary | ICD-10-CM

## 2021-09-08 DIAGNOSIS — R002 Palpitations: Secondary | ICD-10-CM

## 2021-09-08 DIAGNOSIS — L02419 Cutaneous abscess of limb, unspecified: Secondary | ICD-10-CM

## 2021-09-08 DIAGNOSIS — F418 Other specified anxiety disorders: Secondary | ICD-10-CM | POA: Diagnosis not present

## 2021-09-08 DIAGNOSIS — F419 Anxiety disorder, unspecified: Secondary | ICD-10-CM | POA: Insufficient documentation

## 2021-09-08 LAB — CBC
HCT: 37.4 % (ref 36.0–46.0)
Hemoglobin: 12.6 g/dL (ref 12.0–15.0)
MCHC: 33.6 g/dL (ref 30.0–36.0)
MCV: 96.1 fl (ref 78.0–100.0)
Platelets: 329 10*3/uL (ref 150.0–400.0)
RBC: 3.89 Mil/uL (ref 3.87–5.11)
RDW: 14.6 % (ref 11.5–15.5)
WBC: 10.7 10*3/uL — ABNORMAL HIGH (ref 4.0–10.5)

## 2021-09-08 LAB — COMPREHENSIVE METABOLIC PANEL
ALT: 20 U/L (ref 0–35)
AST: 30 U/L (ref 0–37)
Albumin: 4.7 g/dL (ref 3.5–5.2)
Alkaline Phosphatase: 61 U/L (ref 39–117)
BUN: 8 mg/dL (ref 6–23)
CO2: 21 mEq/L (ref 19–32)
Calcium: 9.5 mg/dL (ref 8.4–10.5)
Chloride: 104 mEq/L (ref 96–112)
Creatinine, Ser: 1.07 mg/dL (ref 0.40–1.20)
GFR: 68.58 mL/min (ref >60.00)
Glucose, Bld: 82 mg/dL (ref 70–99)
Potassium: 3.7 mEq/L (ref 3.5–5.1)
Sodium: 140 mEq/L (ref 135–145)
Total Bilirubin: 0.5 mg/dL (ref 0.2–1.2)
Total Protein: 7.6 g/dL (ref 6.0–8.3)

## 2021-09-08 LAB — HEMOGLOBIN A1C: Hgb A1c MFr Bld: 5.9 % (ref 4.6–6.5)

## 2021-09-08 LAB — LDL CHOLESTEROL, DIRECT: Direct LDL: 83 mg/dL

## 2021-09-08 LAB — VITAMIN D 25 HYDROXY (VIT D DEFICIENCY, FRACTURES): VITD: 17.37 ng/mL — ABNORMAL LOW (ref 30.00–100.00)

## 2021-09-08 LAB — TSH: TSH: 0.86 u[IU]/mL (ref 0.35–5.50)

## 2021-09-08 MED ORDER — VITAMIN D (CHOLECALCIFEROL) 25 MCG (1000 UT) PO TABS: 1000.0000 [IU] | ORAL_TABLET | Freq: Every day | ORAL | 11 refills | Status: DC

## 2021-09-08 MED ORDER — LOSARTAN POTASSIUM 100 MG PO TABS: 100.0000 mg | ORAL_TABLET | Freq: Every day | ORAL | 5 refills | Status: DC

## 2021-09-08 MED ORDER — QUETIAPINE FUMARATE 25 MG PO TABS: 25.0000 mg | ORAL_TABLET | Freq: Every day | ORAL | 11 refills | Status: DC

## 2021-09-08 MED ORDER — THIAMINE HCL 100 MG PO TABS: 100.0000 mg | ORAL_TABLET | Freq: Every day | ORAL | 11 refills | Status: DC

## 2021-09-08 MED ORDER — METOPROLOL SUCCINATE ER 25 MG PO TB24: 25.0000 mg | ORAL_TABLET | Freq: Every day | ORAL | 5 refills | Status: DC

## 2021-09-08 MED ORDER — VITAMIN B-12 1000 MCG PO TABS: 1000.0000 ug | ORAL_TABLET | Freq: Every day | ORAL | 11 refills | Status: DC

## 2021-09-08 MED ORDER — NALTREXONE HCL 50 MG PO TABS: 50.0000 mg | ORAL_TABLET | Freq: Every day | ORAL | 5 refills | Status: DC

## 2021-09-08 MED ORDER — VENLAFAXINE HCL ER 75 MG PO CP24: 75.0000 mg | ORAL_CAPSULE | Freq: Every day | ORAL | 5 refills | Status: DC

## 2021-09-08 MED ORDER — GABAPENTIN 300 MG PO CAPS: ORAL_CAPSULE | ORAL | 5 refills | Status: DC

## 2021-09-08 NOTE — Progress Notes (Signed)
This visit occurred during the SARS-CoV-2 public health emergency.  Safety protocols were in place, including screening questions prior to the visit, additional usage of staff PPE, and extensive cleaning of exam room while observing appropriate contact time as indicated for disinfecting solutions.    Frances Powell , [birth year], 35 y.o., female MRN: [id number] Patient Care Team    Relationship Specialty Notifications Start End  Natalia Leatherwood, DO PCP - General Family Medicine  09/24/19   Tarry Kos, MD Attending Physician Orthopedic Surgery  09/24/19     Chief Complaint  Patient presents with   Hypertension    [facility];      Subjective: Pt presents for an OV for follow-up on her [facility] Axillary abscess Completed her Bactrim course and abscess has resolved.  Patient reports no erythema or tenderness remains.  Hypertension/palpitations/obesity (BMI 30-39.9) Pt reports compliance with metoprolol 25 mg QD and  losartan 100 mg QD. Patient denies chest pain, shortness of breath, dizziness or lower extremity edema.   Malabsorption syndrome/Functional diarrhea/vit d def/b12 def/b1 def She has been without her vitamin supplementations.  She ran out of meds and did not realize they were over-the-counter.   Mild recurrent major depression (HCC)/Anxiety Patient reports she is feeling well on Effexor 75 mg daily and she cut back to the Seroquel 25 mg nightly because it was making her very sleepy.  She is also prescribed gabapentin 300/300/600.   Alcohol dependence (HCC)/Elevated LFTs/Highly echogenic liver on ultrasound Reports she has less than 1 drink a week now.  She is feeling much improved.  She has lost 6 pounds.  She is feeling better.  She has been ready to quit.  She wants to stay on her current regimen. abd [place] results reviewed with her in person today- and all prior labs in detail.    No LMP recorded. (Menstrual status: Irregular Periods).   Depression screen [facility] 2/9 09/08/2021  10/19/2020 11/25/2019 09/24/2019  Decreased Interest 2 3 0 3  Down, Depressed, Hopeless 1 3 1 2   PHQ - 2 Score 3 6 1 5   Altered sleeping 3 3 1 3   Tired, decreased energy 2 3 2 3   Change in appetite 0 0 1 1  Feeling bad or failure about yourself  1 3 0 3  Trouble concentrating 1 2 2 1   Moving slowly or fidgety/restless 0 3 0 2  Suicidal thoughts 0 1 0 0  PHQ-9 Score 10 21 7 18   Difficult doing work/chores - - Very difficult Very difficult   GAD 7 : Generalized Anxiety Score 09/08/2021 10/19/2020 11/25/2019 09/24/2019  Nervous, Anxious, on Edge 3 3 3 3   Control/stop worrying 3 3 3 3   Worry too much - different things 3 3 3 3   Trouble relaxing 2 3 3 3   Restless 0 0 1 2  Easily annoyed or irritable 1 3 1 3   Afraid - awful might happen 1 1 3 3   Total GAD 7 Score 13 16 17 20   Anxiety Difficulty - - Very difficult -    No Known Allergies Social History   Social History Narrative   Marital status/children/pets: married   Education/employment: HS diploma- works as a .    Safety:      -smoke alarm in the home:Yes     - wears seatbelt: Yes     - Feels safe in their relationships: Yes   Past Medical History:  Diagnosis Date   Alcohol abuse    Depression  Migraines    Toe fracture 05/11/2021   Past Surgical History:  Procedure Laterality Date   APPENDECTOMY  2010   Family History  Problem Relation Age of Onset   Arthritis Mother    Depression Mother    Lupus Mother    Hypertension Mother    Kidney disease Mother    Miscarriages / [place] Mother    Leukemia Father    Early death Father    Kidney disease Maternal Grandmother    Cancer Maternal Grandfather    Heart attack Paternal Grandfather    Early death Sister    Heart disease Sister    Cervical cancer Sister    Allergies as of 09/08/2021   No Known Allergies      Medication List        Accurate as of September 08, 2021  1:28 PM. If you have any questions, ask your nurse or doctor.           STOP taking these medications    sulfamethoxazole-trimethoprim 800-160 MG tablet Commonly known as: BACTRIM DS Stopped by: Felix Pacini, DO       TAKE these medications    cholestyramine 4 g packet Commonly known as: Questran 2-3x daily with meals.All other  Medications must be taken at least 1 hour prior to use of product or 6 hours after product use.   gabapentin 300 MG capsule Commonly known as: NEURONTIN 300 mg in morning, 300 mg in the afternoon and 600 mg before bed   losartan 100 MG tablet Commonly known as: Cozaar Take 1 tablet (100 mg total) by mouth daily.   metoprolol succinate 25 MG 24 hr tablet Commonly known as: TOPROL-XL Take 1 tablet (25 mg total) by mouth daily.   naltrexone 50 MG tablet Commonly known as: DEPADE Take 1 tablet (50 mg total) by mouth daily.   QUEtiapine 25 MG tablet Commonly known as: SEROquel Take 1 tablet (25 mg total) by mouth at bedtime. What changed:  medication strength how much to take Changed by: Felix Pacini, DO   thiamine 100 MG tablet Take 1 tablet (100 mg total) by mouth daily.   venlafaxine XR 75 MG 24 hr capsule Commonly known as: Effexor XR Take 1 capsule (75 mg total) by mouth daily with breakfast.   vitamin B-12 1000 MCG tablet Commonly known as: CYANOCOBALAMIN Take 1 tablet (1,000 mcg total) by mouth daily.   Vitamin D (Cholecalciferol) 25 MCG (1000 UT) Tabs Take 1,000 Units by mouth daily. 1 tab daily, except for the day high-dose vitamin D tab is taken What changed:  how much to take how to take this when to take this Changed by: Felix Pacini, DO        All past medical history, surgical history, allergies, family history, immunizations andmedications were updated in the EMR today and reviewed under the history and medication portions of their EMR.     ROS: Negative, with the exception of above mentioned in HPI   Objective:  BP 125/84   Pulse 82   Temp 97.9 F (36.6 C) (Oral)   Ht 5\' 5"   (1.651 m)   Wt 204 lb (92.5 kg)   SpO2 98%   BMI 33.95 kg/m  Body mass index is 33.95 kg/m. Gen: Afebrile. No acute distress.  Nontoxic, pleasant female.  Obese. HENT: AT. [place].  No cough.  No hoarseness. Eyes:Pupils Equal Round Reactive to light, Extraocular movements intact,  Conjunctiva without redness, discharge or icterus. Neck/lymp/endocrine: Supple, no lymphadenopathy, no thyromegaly  CV: RRR no murmur, no edema, +2/4 P posterior tibialis pulses Chest: CTAB, no wheeze or crackles Abd: Soft.  Obese. NTND. BS present.  No skin: No abscess, no rashes, purpura or petechiae.  Neuro:  Normal gait. PERLA. EOMi. Alert. Oriented x3 Psych: Normal affect, dress and demeanor. Normal speech. Normal thought content and judgment.    No results found. No results found. No results found for this or any previous visit (from the past 24 hour(s)).  Assessment/Plan: Frances Powell is a 36 y.o. female present for OV for  Tobacco use disorder Encouraged smoking cessation  Vitamin D deficiency/malabsorption syndrome/B12 deficiency/B1 deficiency/functional diarrhea Restart vitamin D 1000 units daily, B12 1000 mcg daily and thiamine OTC if not covered by insurance. - VITAMIN D 25 Hydroxy (Vit-D Deficiency, Fractures) -Continue Questran as needed  Axillary abscess Resolved with antibiotic treatment.  Elevated glucose - Hemoglobin A1c  Mild recurrent major depression (HCC)/Anxiety Stable Continue Effexor 75 mg daily. Continue Seroquel 25 mg daily. Continue gabapentin 300/300/600  Essential hypertension/obesity/palpitations Stable Continue metoprolol 25 mg daily Continue losartan to 100 mg daily - Comprehensive metabolic panel - CBC - TSH - Direct LDL Follow-up 5.5 months on chronic conditions unless labs indicate need to bring her back sooner.  alcohol dependence (HCC)/elevated LFTs She has cut back even further on her alcohol consumption and feeling much better.  She has lost 6  pounds.  She is limiting her alcohol consumption to 1 drink a week or less.   discard all alcohol within the home.  She has the support of her wife for this and she also will not partake in alcohol use.   Quit date goal of 11/18/2021 continue Depade.   Continue gabapentin 300/300/600  Continue thiamine, B12 and vitamin D- Hepatitis B #2 provided today.  Patient will make a nurse visit in 9 weeks for hep B #3 . Follow-up in 5.5 months, unless needed sooner her labs indicate need to bring her back sooner  Return in about 6 months (around 02/27/2022) for [facility] (30 min). 9 weeks for nurse visit for hepatitis B #3  Reviewed expectations re: course of current medical issues. Discussed self-management of symptoms. Outlined signs and symptoms indicating need for more acute intervention. Patient verbalized understanding and all questions were answered. Patient received an After-Visit Summary.    Orders Placed This Encounter  Procedures   Hepatitis B vaccine adult IM   Hemoglobin A1c   Comprehensive metabolic panel   VITAMIN D 25 Hydroxy (Vit-D Deficiency, Fractures)   CBC   TSH   Direct LDL    Meds ordered this encounter  Medications   gabapentin (NEURONTIN) 300 MG capsule    Sig: 300 mg in morning, 300 mg in the afternoon and 600 mg before bed    Dispense:  120 capsule    Refill:  5   losartan (COZAAR) 100 MG tablet    Sig: Take 1 tablet (100 mg total) by mouth daily.    Dispense:  30 tablet    Refill:  5   metoprolol succinate (TOPROL-XL) 25 MG 24 hr tablet    Sig: Take 1 tablet (25 mg total) by mouth daily.    Dispense:  30 tablet    Refill:  5   naltrexone (DEPADE) 50 MG tablet    Sig: Take 1 tablet (50 mg total) by mouth daily.    Dispense:  30 tablet    Refill:  5   QUEtiapine (SEROQUEL) 25 MG tablet    Sig: Take 1 tablet (  25 mg total) by mouth at bedtime.    Dispense:  30 tablet    Refill:  11   thiamine 100 MG tablet    Sig: Take 1 tablet (100 mg total) by mouth  daily.    Dispense:  30 tablet    Refill:  11   venlafaxine XR (EFFEXOR XR) 75 MG 24 hr capsule    Sig: Take 1 capsule (75 mg total) by mouth daily with breakfast.    Dispense:  30 capsule    Refill:  5   vitamin B-12 (CYANOCOBALAMIN) 1000 MCG tablet    Sig: Take 1 tablet (1,000 mcg total) by mouth daily.    Dispense:  30 tablet    Refill:  11   Vitamin D, Cholecalciferol, 25 MCG (1000 UT) TABS    Sig: Take 1,000 Units by mouth daily. 1 tab daily, except for the day high-dose vitamin D tab is taken    Dispense:  30 tablet    Refill:  11    Referral Orders  No referral(s) requested today     Note is dictated utilizing voice recognition software. Although note has been proof read prior to signing, occasional typographical errors still can be missed. If any questions arise, please do not hesitate to call for verification.   electronically signed by:  Felix Pacini, DO  [facility] [place]

## 2021-09-08 NOTE — Patient Instructions (Signed)
Great to see you today.  I have refilled the medication(s) we provide.   If labs were collected, we will inform you of lab results once received either by echart message or telephone call.   - echart message- for normal results that have been seen by the patient already.   - telephone call: abnormal results or if patient has not viewed results in their echart.  

## 2021-09-24 ENCOUNTER — Encounter: Payer: Self-pay | Admitting: Family Medicine

## 2021-09-27 ENCOUNTER — Emergency Department (HOSPITAL_BASED_OUTPATIENT_CLINIC_OR_DEPARTMENT_OTHER)
Admission: EM | Admit: 2021-09-27 | Discharge: 2021-09-27 | Disposition: A | Discharge status: Home / Self Care | Payer: 59 | Attending: Emergency Medicine | Admitting: Emergency Medicine

## 2021-09-27 ENCOUNTER — Emergency Department (HOSPITAL_BASED_OUTPATIENT_CLINIC_OR_DEPARTMENT_OTHER): Payer: 59

## 2021-09-27 ENCOUNTER — Other Ambulatory Visit: Payer: Self-pay

## 2021-09-27 DIAGNOSIS — Z79899 Other long term (current) drug therapy: Secondary | ICD-10-CM | POA: Insufficient documentation

## 2021-09-27 DIAGNOSIS — F17210 Nicotine dependence, cigarettes, uncomplicated: Secondary | ICD-10-CM | POA: Insufficient documentation

## 2021-09-27 DIAGNOSIS — I10 Essential (primary) hypertension: Secondary | ICD-10-CM | POA: Diagnosis not present

## 2021-09-27 DIAGNOSIS — M79674 Pain in right toe(s): Secondary | ICD-10-CM

## 2021-09-27 DIAGNOSIS — M109 Gout, unspecified: Secondary | ICD-10-CM | POA: Diagnosis not present

## 2021-09-27 MED ORDER — COLCHICINE 0.6 MG PO TABS: 0.6000 mg | ORAL_TABLET | Freq: Every day | ORAL | 0 refills | Status: DC

## 2021-09-27 MED ORDER — COLCHICINE 0.6 MG PO TABS
1.2000 mg | ORAL_TABLET | Freq: Once | ORAL | Status: AC
  Administered 2021-09-27: 1.2 mg via ORAL
  Filled 2021-09-27: qty 2

## 2021-09-27 NOTE — ED Triage Notes (Signed)
Pt c/o right foot swelling and redness over the weekend. States swelling improved but then worse again. Denies fevers.

## 2021-09-27 NOTE — ED Provider Notes (Signed)
[facility] EMERGENCY DEPARTMENT Provider Note   CSN: [id number] Arrival date & time: 09/27/21  1114     History Chief Complaint  Patient presents with   Foot Swelling    Frances Powell is a 35 y.o. female.  Presenting to the ER with concern for foot swelling, redness.  Reports that she has had various flares of foot pain and swelling over the past year or so.  States that she has had pain over the past couple days primarily to her right big toe.  Noted slight redness and some swelling on that area.  No fevers or chills.  Has tried over-the-counter anti-inflammatories with minimal relief.  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   Essential hypertension 11/07/2019   Palpitation 11/07/2019   Obesity (BMI 30-39.9) 09/25/2019    Past Surgical History:  Procedure Laterality Date   APPENDECTOMY  2010     OB History     Gravida  2   Para  0   Term      Preterm      AB  2   Living  0      SAB      IAB      Ectopic      Multiple      Live Births              Family History  Problem Relation Age of Onset   Arthritis Mother    Depression Mother    Lupus Mother    Hypertension Mother    Kidney disease Mother    Miscarriages / [place] Mother    Leukemia Father    Early death Father    Kidney disease Maternal Grandmother    Cancer Maternal Grandfather    Heart attack Paternal Grandfather    Early death Sister    Heart disease Sister    Cervical  cancer Sister     Social History   Tobacco Use   Smoking status: Every Day    Packs/day: 1.00    Years: 14.00    Pack years: 14.00    Types: Cigarettes   Smokeless tobacco: Never  Vaping Use   Vaping Use: Never used  Substance Use Topics   Alcohol use: Yes    Alcohol/week: 2.0 standard drinks    Types: 2 Standard drinks or equivalent per week   Drug use: Yes    Frequency: 2.0 times per week    Types: Marijuana    Home Medications Prior to Admission medications   Medication Sig Start Date End Date Taking? Authorizing Provider  colchicine 0.6 MG tablet Take 1 tablet (0.6 mg total) by mouth daily. 09/27/21  Yes Milagros Loll, MD  cholestyramine Lanetta Inch) 4 g packet 2-3x daily with meals.All other  Medications must be taken at least 1 hour prior to use of product or 6 hours after product use. 10/20/20   Kuneff, Renee A, DO  gabapentin (NEURONTIN) 300 MG capsule 300  mg in morning, 300 mg in the afternoon and 600 mg before bed 09/08/21   Kuneff, Renee A, DO  losartan (COZAAR) 100 MG tablet Take 1 tablet (100 mg total) by mouth daily. 09/08/21   Kuneff, Renee A, DO  metoprolol succinate (TOPROL-XL) 25 MG 24 hr tablet Take 1 tablet (25 mg total) by mouth daily. 09/08/21   Kuneff, Renee A, DO  naltrexone (DEPADE) 50 MG tablet Take 1 tablet (50 mg total) by mouth daily. 09/08/21   Kuneff, Renee A, DO  QUEtiapine (SEROQUEL) 25 MG tablet Take 1 tablet (25 mg total) by mouth at bedtime. 09/08/21   Kuneff, Renee A, DO  thiamine 100 MG tablet Take 1 tablet (100 mg total) by mouth daily. 09/08/21   Kuneff, Renee A, DO  venlafaxine XR (EFFEXOR XR) 75 MG 24 hr capsule Take 1 capsule (75 mg total) by mouth daily with breakfast. 09/08/21   Kuneff, Renee A, DO  vitamin B-12 (CYANOCOBALAMIN) 1000 MCG tablet Take 1 tablet (1,000 mcg total) by mouth daily. 09/08/21   Kuneff, Renee A, DO  Vitamin D, Cholecalciferol, 25 MCG (1000 UT) TABS Take 1,000 Units by mouth daily. 1 tab daily, except for the day  high-dose vitamin D tab is taken 09/08/21   Claiborne Billings, Renee A, DO    Allergies    Patient has no known allergies.  Review of Systems   Review of Systems  Musculoskeletal:  Positive for arthralgias.  All other systems reviewed and are negative.  Physical Exam Updated Vital Signs BP (!) 187/107 (BP Location: Right Arm)   Pulse 81   Temp 98.1 F (36.7 C) (Oral)   Resp 18   Ht 5\' 5"  (1.651 m)   Wt 93 kg   SpO2 100%   BMI 34.11 kg/m   Physical Exam Vitals and nursing note reviewed.  Constitutional:      General: She is not in acute distress.    Appearance: She is well-developed.  HENT:     Head: Normocephalic and atraumatic.  Eyes:     Conjunctiva/sclera: Conjunctivae normal.  Cardiovascular:     Rate and Rhythm: Normal rate and regular rhythm.     Heart sounds: No murmur heard. Pulmonary:     Effort: Pulmonary effort is normal. No respiratory distress.  Musculoskeletal:     Cervical back: Neck supple.     Comments: R foot: There is slight redness and some swelling at the base of the great toe, normal joint ROM throughout foot and ankle, no generalized erythema or swelling, normal DP/PT pulse  Skin:    General: Skin is warm and dry.  Neurological:     General: No focal deficit present.     Mental Status: She is alert.    ED Results / Procedures / Treatments   Labs (all labs ordered are listed, but only abnormal results are displayed) Labs Reviewed - No data to display  EKG None  Radiology DG Foot Complete Right  Result Date: 09/27/2021 CLINICAL DATA:  Foot swelling and redness over first MTP joint EXAM: RIGHT FOOT COMPLETE - 3+ VIEW COMPARISON:  Contralateral foot radiographs 05/04/2021 FINDINGS: There is no acute fracture or dislocation. Bony alignment is normal. The joint spaces are preserved. There is no erosive change. The soft tissues are unremarkable. IMPRESSION: Normal foot radiographs. Electronically Signed   By: 07/04/2021 M.D.   On: 09/27/2021 12:31     Procedures Procedures   Medications Ordered in ED Medications  colchicine tablet 1.2 mg (1.2 mg Oral  Given 09/27/21 1309)    ED Course  I have reviewed the triage vital signs and the nursing notes.  Pertinent labs & imaging results that were available during my care of the patient were reviewed by me and considered in my medical decision making (see chart for details).    MDM Rules/Calculators/A&P                           35 year old who presented to ER with concern for right foot swelling/pain.  On exam she does have mild swelling and redness and tenderness over the base of the great toe.  X-ray negative.  Based on location, and repeated episodes previously, suspect may have gout flare.  Recommend trial of colchicine, gave initial dose here and short course to take home.  Recommend recheck with primary or Ortho/sports medicine.  Reviewed return precautions and discharged.  After the discussed management above, the patient was determined to be safe for discharge.  The patient was in agreement with this plan and all questions regarding their care were answered.  ED return precautions were discussed and the patient will return to the ED with any significant worsening of condition.  Final Clinical Impression(s) / ED Diagnoses Final diagnoses:  Acute gout, unspecified cause, unspecified site  Pain of toe of right foot    Rx / DC Orders ED Discharge Orders          Ordered    colchicine 0.6 MG tablet  Daily        09/27/21 1328             Milagros Loll, MD 09/27/21 1444

## 2021-09-27 NOTE — Discharge Instructions (Signed)
Recommend follow-up with an orthopedic or sports medicine specialist regarding your toe pain.  If you develop worsening swelling, redness, fever, come back to ER for reassessment.  In the meantime you may also try following up with your primary care doctor, who can assist with management.

## 2021-09-30 ENCOUNTER — Ambulatory Visit (INDEPENDENT_AMBULATORY_CARE_PROVIDER_SITE_OTHER): Payer: 59 | Admitting: Family Medicine

## 2021-09-30 ENCOUNTER — Encounter: Payer: Self-pay | Admitting: Family Medicine

## 2021-09-30 ENCOUNTER — Other Ambulatory Visit: Payer: Self-pay

## 2021-09-30 VITALS — BP 144/88 | HR 78 | Temp 97.7°F | Ht 65.0 in | Wt 207.0 lb

## 2021-09-30 DIAGNOSIS — R2241 Localized swelling, mass and lump, right lower limb: Secondary | ICD-10-CM | POA: Diagnosis not present

## 2021-09-30 DIAGNOSIS — M79671 Pain in right foot: Secondary | ICD-10-CM | POA: Diagnosis not present

## 2021-09-30 LAB — URIC ACID: Uric Acid, Serum: 9.2 mg/dL — ABNORMAL HIGH (ref 2.4–7.0)

## 2021-09-30 MED ORDER — PREDNISONE 20 MG PO TABS: ORAL_TABLET | ORAL | 0 refills | Status: DC

## 2021-09-30 MED ORDER — METHYLPREDNISOLONE ACETATE 80 MG/ML IJ SUSP: 80.0000 mg | Freq: Once | INTRAMUSCULAR | Status: DC

## 2021-09-30 NOTE — Patient Instructions (Signed)
Low-Purine Eating Plan A low-purine eating plan involves making food choices to limit your intake of purine. Purine is a kind of uric acid. Too much uric acid in your blood can cause certain conditions, such as gout and kidney stones. Eating a low-purinediet can help control these conditions. What are tips for following this plan? Reading food labels Avoid foods with saturated or Trans fat. Check the ingredient list of grains-based foods, such as bread and cereal, to make sure that they contain whole grains. Check the ingredient list of sauces or soups to make sure they do not contain meat or fish. When choosing soft drinks, check the ingredient list to make sure they do not contain high-fructose corn syrup. Shopping  Buy plenty of fresh fruits and vegetables. Avoid buying canned or fresh fish. Buy dairy products labeled as low-fat or nonfat. Avoid buying premade or processed foods. These foods are often high in fat, salt (sodium), and added sugar.  Cooking Use olive oil instead of butter when cooking. Oils like olive oil, canola oil, and sunflower oil contain healthy fats. Meal planning Learn which foods do or do not affect you. If you find out that a food tends to cause your gout symptoms to flare up, avoid eating that food. You can enjoy foods that do not cause problems. If you have any questions about a food item, talk with your dietitian or health care provider. Limit foods high in fat, especially saturated fat. Fat makes it harder for your body to get rid of uric acid. Choose foods that are lower in fat and are lean sources of protein. General guidelines Limit alcohol intake to no more than 1 drink a day for nonpregnant women and 2 drinks a day for men. One drink equals 12 oz of beer, 5 oz of wine, or 1 oz of hard liquor. Alcohol can affect the way your body gets rid of uric acid. Drink plenty of water to keep your urine clear or pale yellow. Fluids can help remove uric acid from your  body. If directed by your health care provider, take a vitamin C supplement. Work with your health care provider and dietitian to develop a plan to achieve or maintain a healthy weight. Losing weight can help reduce uric acid in your blood. What foods are recommended? The items listed may not be a complete list. Talk with your dietitian aboutwhat dietary choices are best for you. Foods low in purines Foods low in purines do not need to be limited. These include: All fruits. All low-purine vegetables, pickles, and olives. Breads, pasta, rice, cornbread, and popcorn. Cake and other baked goods. All dairy foods. Eggs, nuts, and nut butters. Spices and condiments, such as salt, herbs, and vinegar. Plant oils, butter, and margarine. Water, sugar-free soft drinks, tea, coffee, and cocoa. Vegetable-based soups, broths, sauces, and gravies. Foods moderate in purines Foods moderate in purines should be limited to the amounts listed.  cup of asparagus, cauliflower, spinach, mushrooms, or green peas, each day. 2/3 cup uncooked oatmeal, each day.  cup dry wheat bran or wheat germ, each day. 2-3 ounces of meat or poultry, each day. 4-6 ounces of shellfish, such as crab, lobster, oysters, or shrimp, each day. 1 cup cooked beans, peas, or lentils, each day. Soup, broths, or bouillon made from meat or fish. Limit these foods as much as possible. What foods are not recommended? The items listed may not be a complete list. Talk with your dietitian aboutwhat dietary choices are best for you.   Limit your intake of foods high in purines, including: Beer and other alcohol. Meat-based gravy or sauce. Canned or fresh fish, such as: Anchovies, sardines, herring, and tuna. Mussels and scallops. Codfish, trout, and haddock. Bacon. Organ meats, such as: Liver or kidney. Tripe. Sweetbreads (thymus gland or pancreas). Wild game or goose. Yeast or yeast extract supplements. Drinks sweetened with  high-fructose corn syrup. Summary Eating a low-purine diet can help control conditions caused by too much uric acid in the body, such as gout or kidney stones. Choose low-purine foods, limit alcohol, and limit foods high in fat. You will learn over time which foods do or do not affect you. If you find out that a food tends to cause your gout symptoms to flare up, avoid eating that food. This information is not intended to replace advice given to you by your health care provider. Make sure you discuss any questions you have with your healthcare provider. Document Revised: 03/04/2020 Document Reviewed: 03/04/2020 Elsevier Patient Education  2022 Elsevier Inc.  

## 2021-09-30 NOTE — Addendum Note (Signed)
Addended by: Maxie Barb on: 09/30/2021 11:53 AM   Modules accepted: Orders

## 2021-09-30 NOTE — Progress Notes (Signed)
This visit occurred during the SARS-CoV-2 public health emergency.  Safety protocols were in place, including screening questions prior to the visit, additional usage of staff PPE, and extensive cleaning of exam room while observing appropriate contact time as indicated for disinfecting solutions.    Frances Powell , [birth year], 35 y.o., female MRN: [id number] Patient Care Team    Relationship Specialty Notifications Start End  Natalia Leatherwood, DO PCP - General Family Medicine  09/24/19   Frances Kos, MD Attending Physician Orthopedic Surgery  09/24/19     Chief Complaint  Patient presents with   Toe Pain    Pt c/o R great toe pain x 1 week     Subjective: Pt presents for an OV to discuss her right foot pain and swelling.  She was seen in the emergency room 3 days ago for this condition.  X-ray was completed at that time which did not show any acute injury or fracture.  She states she has had intermittent right large toe pain and swelling over the last year that typically resolves when she starts an anti-inflammatory.  She has not needed to seek treatment for it in the past.  However she presented to the ED 3 days ago because she had not been taking her naproxen OTC and her symptoms were not relieved.  She received colchicine 1.2 mg at the ED and has been taking 0.6 mg of colchicine daily.  She reports that has made no difference in her pain or swelling.  She has an area over the left first metatarsal that is most tender red and swollen.  She states even light touch feels like her skin is going to rip off. We discussed diet and activity prior to onset of symptoms.  She denies any increase in activity or injury.  She does endorse having a higher purine diet the day before onset. Depression screen [facility] 2/9 09/08/2021 10/19/2020 11/25/2019 09/24/2019  Decreased Interest 2 3 0 3  Down, Depressed, Hopeless 1 3 1 2   PHQ - 2 Score 3 6 1 5   Altered sleeping 3 3 1 3   Tired, decreased energy 2  3 2 3   Change in appetite 0 0 1 1  Feeling bad or failure about yourself  1 3 0 3  Trouble concentrating 1 2 2 1   Moving slowly or fidgety/restless 0 3 0 2  Suicidal thoughts 0 1 0 0  PHQ-9 Score 10 21 7 18   Difficult doing work/chores - - Very difficult Very difficult    No Known Allergies Social History   Social History Narrative   Marital status/children/pets: married   Education/employment: HS diploma- works as a .    Safety:      -smoke alarm in the home:Yes     - wears seatbelt: Yes     - Feels safe in their relationships: Yes   Past Medical History:  Diagnosis Date   Alcohol abuse    Depression    Migraines    Toe fracture 05/11/2021   Past Surgical History:  Procedure Laterality Date   APPENDECTOMY  2010   Family History  Problem Relation Age of Onset   Arthritis Mother    Depression Mother    Lupus Mother    Hypertension Mother    Kidney disease Mother    Miscarriages / Mother    Leukemia Father    Early death Father    Kidney disease Maternal Grandmother    Cancer Maternal Grandfather  Heart attack Paternal Grandfather    Early death Sister    Heart disease Sister    Cervical cancer Sister    Allergies as of 09/30/2021   No Known Allergies      Medication List        Accurate as of September 30, 2021 11:42 AM. If you have any questions, ask your nurse or doctor.          cholestyramine 4 g packet Commonly known as: Questran 2-3x daily with meals.All other  Medications must be taken at least 1 hour prior to use of product or 6 hours after product use.   colchicine 0.6 MG tablet Take 1 tablet (0.6 mg total) by mouth daily.   gabapentin 300 MG capsule Commonly known as: NEURONTIN 300 mg in morning, 300 mg in the afternoon and 600 mg before bed   losartan 100 MG tablet Commonly known as: Cozaar Take 1 tablet (100 mg total) by mouth daily.   metoprolol succinate 25 MG 24 hr tablet Commonly known as:  TOPROL-XL Take 1 tablet (25 mg total) by mouth daily.   naltrexone 50 MG tablet Commonly known as: DEPADE Take 1 tablet (50 mg total) by mouth daily.   predniSONE 20 MG tablet Commonly known as: DELTASONE 60 mg x3d, 40 mg x3d, 20 mg x2d, 10 mg x2d Started by: Felix Pacini, DO   QUEtiapine 25 MG tablet Commonly known as: SEROquel Take 1 tablet (25 mg total) by mouth at bedtime.   thiamine 100 MG tablet Take 1 tablet (100 mg total) by mouth daily.   venlafaxine XR 75 MG 24 hr capsule Commonly known as: Effexor XR Take 1 capsule (75 mg total) by mouth daily with breakfast.   vitamin B-12 1000 MCG tablet Commonly known as: CYANOCOBALAMIN Take 1 tablet (1,000 mcg total) by mouth daily.   Vitamin D (Cholecalciferol) 25 MCG (1000 UT) Tabs Take 1,000 Units by mouth daily. 1 tab daily, except for the day high-dose vitamin D tab is taken        All past medical history, surgical history, allergies, family history, immunizations andmedications were updated in the EMR today and reviewed under the history and medication portions of their EMR.     ROS: Negative, with the exception of above mentioned in HPI   Objective:  BP (!) 144/88   Pulse 78   Temp 97.7 F (36.5 C) (Oral)   Ht 5\' 5"  (1.651 m)   Wt 207 lb (93.9 kg)   SpO2 99%   BMI 34.45 kg/m  Body mass index is 34.45 kg/m. Gen: Afebrile. No acute distress. Nontoxic in appearance, well developed, well nourished.  Appears uncomfortable. HENT: AT. [place].  MSK: Right foot: Mild swelling diffusely right forefoot with redness and localized swelling over medial aspect of first metatarsal joint.  Tenderness to palpation at this area.  Neurovascularly intact proximally and distally. Skin: no rashes, purpura or petechiae.  Neuro: Mild limp present. . EOMi. Alert. Oriented x3 Psych: Normal affect, dress and demeanor. Normal speech. Normal thought content and judgment.  No results found. No results found. No results found for  this or any previous visit (from the past 24 hour(s)).  Assessment/Plan: Frances Powell is a 35 y.o. female present for OV for  Right foot pain/localized swelling Exam is certainly consistent with gout.  She has a high purine diet and feels pork may be related to her flares.  She also has been struggling with alcohol use.  It is surprising she is  not seeing more relief from naproxen and colchicine use. IM Depo-Medrol 80 provided today Start prednisone taper tomorrow - Uric acid We discussed low purine diet and she was provided with low purine diet education to take home. We discussed allopurinol use if uric acid is elevated.  This would need to be started after acute gout flare is completely resolved.  She reports understanding.  We would also need to follow-up by lab appointment only in about 4 weeks on uric acid levels to ensure we are at goal.  She reports understanding.  She has a nurse visit already scheduled around December 9, therefore if allopurinol is started she can make a lab appointment on that same day for convenience.    Reviewed expectations re: course of current medical issues. Discussed self-management of symptoms. Outlined signs and symptoms indicating need for more acute intervention. Patient verbalized understanding and all questions were answered. Patient received an After-Visit Summary.    Orders Placed This Encounter  Procedures   Uric acid   Meds ordered this encounter  Medications   predniSONE (DELTASONE) 20 MG tablet    Sig: 60 mg x3d, 40 mg x3d, 20 mg x2d, 10 mg x2d    Dispense:  18 tablet    Refill:  0    Referral Orders  No referral(s) requested today     Note is dictated utilizing voice recognition software. Although note has been proof read prior to signing, occasional typographical errors still can be missed. If any questions arise, please do not hesitate to call for verification.   electronically signed by:  Felix Pacini, DO  [facility] [place]

## 2021-10-03 ENCOUNTER — Telehealth: Payer: Self-pay | Admitting: Family Medicine

## 2021-10-03 DIAGNOSIS — M109 Gout, unspecified: Secondary | ICD-10-CM

## 2021-10-03 MED ORDER — ALLOPURINOL 100 MG PO TABS: 100.0000 mg | ORAL_TABLET | Freq: Every day | ORAL | 6 refills | Status: DC

## 2021-10-03 NOTE — Telephone Encounter (Signed)
Please inform patient her uric acid is elevated, indicating that her foot pain is from gout flare. Continue the medications as prescribed.  Once all pain and redness/swelling is resolved completely.  She can start the allopurinol once daily called in.  She already has a nurse visit on December 9.  Please make her a lab visit on the same day so that she can have the uric acid retested at that time.  It is important that she work on hydration and a low purine diet to get the uric acid level down.

## 2021-10-03 NOTE — Telephone Encounter (Signed)
LVM for pt to CB regarding results.  

## 2021-10-04 NOTE — Telephone Encounter (Signed)
Spoke with pt regarding labs and instructions.   

## 2021-10-06 ENCOUNTER — Ambulatory Visit: Payer: 59 | Admitting: Family Medicine

## 2021-11-11 ENCOUNTER — Ambulatory Visit: Payer: 59

## 2022-02-13 ENCOUNTER — Encounter: Payer: Self-pay | Admitting: Family Medicine

## 2022-03-01 ENCOUNTER — Ambulatory Visit: Payer: 59 | Admitting: Family Medicine

## 2022-03-01 ENCOUNTER — Encounter: Payer: Self-pay | Admitting: Family Medicine

## 2022-03-01 VITALS — BP 128/82 | HR 89 | Temp 98.2°F | Ht 65.0 in | Wt 203.0 lb

## 2022-03-01 DIAGNOSIS — F419 Anxiety disorder, unspecified: Secondary | ICD-10-CM

## 2022-03-01 DIAGNOSIS — F1020 Alcohol dependence, uncomplicated: Secondary | ICD-10-CM

## 2022-03-01 DIAGNOSIS — M109 Gout, unspecified: Secondary | ICD-10-CM | POA: Diagnosis not present

## 2022-03-01 DIAGNOSIS — K909 Intestinal malabsorption, unspecified: Secondary | ICD-10-CM

## 2022-03-01 DIAGNOSIS — E669 Obesity, unspecified: Secondary | ICD-10-CM

## 2022-03-01 DIAGNOSIS — R7989 Other specified abnormal findings of blood chemistry: Secondary | ICD-10-CM

## 2022-03-01 DIAGNOSIS — R932 Abnormal findings on diagnostic imaging of liver and biliary tract: Secondary | ICD-10-CM

## 2022-03-01 DIAGNOSIS — E559 Vitamin D deficiency, unspecified: Secondary | ICD-10-CM

## 2022-03-01 DIAGNOSIS — F330 Major depressive disorder, recurrent, mild: Secondary | ICD-10-CM

## 2022-03-01 DIAGNOSIS — I10 Essential (primary) hypertension: Secondary | ICD-10-CM

## 2022-03-01 DIAGNOSIS — R002 Palpitations: Secondary | ICD-10-CM

## 2022-03-01 MED ORDER — LOSARTAN POTASSIUM 100 MG PO TABS: 100.0000 mg | ORAL_TABLET | Freq: Every day | ORAL | 5 refills | Status: DC

## 2022-03-01 MED ORDER — METOPROLOL SUCCINATE ER 50 MG PO TB24: 50.0000 mg | ORAL_TABLET | Freq: Every day | ORAL | 5 refills | Status: DC

## 2022-03-01 NOTE — Progress Notes (Signed)
? ? ? ?This visit occurred during the SARS-CoV-2 public health emergency.  Safety protocols were in place, including screening questions prior to the visit, additional usage of staff PPE, and extensive cleaning of exam room while observing appropriate contact time as indicated for disinfecting solutions.  ? ? ?Geroge Baseman , 1986/09/07, 36 y.o., female ?MRN: [id number] ?Patient Care Team  ?  Relationship Specialty Notifications Start End  ?Ma Hillock, DO PCP - General Family Medicine  09/24/19   ?Leandrew Koyanagi, MD Attending Physician Orthopedic Surgery  09/24/19   ? ? ?Chief Complaint  ?Patient presents with  ? Hypertension  ?  [place]; pt is fasting  ? ?  ?Subjective: Pt presents for an OV for follow-up on her [facility] ?Hypertension/palpitations/obesity (BMI 30-39.9) ?Pt reports compliance with metoprolol 25 mg QD and  losartan 100 mg QD. Patient denies chest pain, shortness of breath, dizziness or lower extremity edema.  ? 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  ?Mild recurrent major depression (HCC)/Anxiety ?Patient reports she is f feeling very well on Effexor 75 mg daily and Seroquel 25 mg nightly.  She is no longer taking the gabapentin.   ?  ?Alcohol dependence (HCC)/Elevated LFTs/Highly echogenic liver on ultrasound ?Reports she is feeling better and she is avoiding alcohol use.   ? ?Gout: ?Patient reports she has stayed on the allopurinol 100 mg daily and has not had a flare since starting this medication.  She is due for uric acid follow-up today ?No LMP recorded. (Menstrual status: Irregular Periods). ? ? ? ?  03/01/2022  ?  2:59 PM 09/08/2021  ? 11:29 AM 10/19/2020  ?  2:44 PM 11/25/2019  ?  2:02 PM 09/24/2019  ? 11:09 AM  ?Depression screen PHQ 2/9  ?Decreased Interest 1 2 3  0 3  ?Down, Depressed, Hopeless 0 1 3 1 2   ?PHQ - 2 Score 1 3 6 1 5   ?Altered sleeping 0 3 3 1 3   ?Tired, decreased energy 1 2 3 2 3   ?Change in appetite 2  0 0 1 1  ?Feeling bad or failure about yourself  1 1 3  0 3  ?Trouble concentrating 1 1 2 2 1   ?Moving slowly or fidgety/restless 0 0 3 0 2  ?Suicidal thoughts 0 0 1 0 0  ?PHQ-9 Score 6 10 21 7 18   ?Difficult doing work/chores    Very difficult Very difficult  ? ? ?  03/01/2022  ?  2:59 PM 09/08/2021  ? 11:41 AM 10/19/2020  ?  2:46 PM 11/25/2019  ?  2:03 PM  ?GAD 7 : Generalized Anxiety Score  ?Nervous, Anxious, on Edge 2 3 3 3   ?Control/stop worrying 1 3 3 3   ?Worry too much - different things 1 3 3 3   ?Trouble relaxing 1 2 3 3   ?Restless 0 0 0 1  ?Easily annoyed or irritable 1 1 3 1   ?Afraid - awful might happen 1 1 1 3   ?Total GAD 7 Score 7 13 16 17   ?Anxiety Difficulty    Very difficult  ? ? ?No Known Allergies ?Social History  ? ?Social History Narrative  ? Marital status/children/pets: married  ? 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   ? Safety:   ?   -smoke alarm in the home:Yes  ?   - wears seatbelt: Yes  ?   - Feels safe in their relationships: Yes  ? ?Past Medical History:  ?Diagnosis Date  ?  Alcohol abuse   ? Axillary abscess 08/24/2021  ? Depression   ? 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 Migraines   ? Toe fracture 05/11/2021  ? ?Past Surgical History:  ?Procedure Laterality Date  ? APPENDECTOMY  2010  ? ?Family History  ?Problem Relation Age of Onset  ? Arthritis Mother   ? Depression Mother   ? Lupus Mother   ? Hypertension Mother   ? Kidney disease Mother   ? Miscarriages / [place] Mother   ? Leukemia Father   ? Early death Father   ? Kidney disease Maternal Grandmother   ? Cancer Maternal Grandfather   ? Heart attack Paternal Grandfather   ? Early death Sister   ? Heart disease Sister   ? Cervical cancer Sister   ? ?Allergies as of 03/01/2022   ?No Known Allergies ?  ? ?  ?Medication List  ?  ? ?  ? Accurate as of March 01, 2022 11:59 PM. If you have any questions, ask your nurse or doctor.  ?  ?  ? ?  ? ?STOP taking these medications   ? ?cholestyramine 4 g packet ?Commonly known as:  Questran ?Stopped by: Howard Pouch, DO ?  ?colchicine 0.6 MG tablet ?Stopped by: Howard Pouch, DO ?  ?gabapentin 300 MG capsule ?Commonly known as: NEURONTIN ?Stopped by: Howard Pouch, DO ?  ?naltrexone 50 MG tablet ?Commonly known as: DEPADE ?Stopped by: Howard Pouch, DO ?  ?predniSONE 20 MG tablet ?Commonly known as: DELTASONE ?Stopped by: Howard Pouch, DO ?  ?venlafaxine XR 75 MG 24 hr capsule ?Commonly known as: Effexor XR ?Stopped by: Howard Pouch, DO ?  ? ?  ? ?TAKE these medications   ? ?allopurinol 100 MG tablet ?Commonly known as: ZYLOPRIM ?Take 1 tablet (100 mg total) by mouth daily. ?  ?losartan 100 MG tablet ?Commonly known as: Cozaar ?Take 1 tablet (100 mg total) by mouth daily. ?  ?metoprolol succinate 50 MG 24 hr tablet ?Commonly known as: TOPROL-XL ?Take 1 tablet (50 mg total) by mouth daily. ?What changed:  ?medication strength ?how much to take ?Changed by: Howard Pouch, DO ?  ?QUEtiapine 25 MG tablet ?Commonly known as: SEROquel ?Take 1 tablet (25 mg total) by mouth at bedtime. ?  ?thiamine 100 MG tablet ?Take 1 tablet (100 mg total) by mouth daily. ?  ?vitamin B-12 1000 MCG tablet ?Commonly known as: CYANOCOBALAMIN ?Take 1 tablet (1,000 mcg total) by mouth daily. ?  ?Vitamin D (Cholecalciferol) 25 MCG (1000 UT) Tabs ?Take 1,000 Units by mouth daily. 1 tab daily, except for the day high-dose vitamin D tab is taken ?  ? ?  ? ? ?All past medical history, surgical history, allergies, family history, immunizations andmedications were updated in the EMR today and reviewed under the history and medication portions of their EMR.    ? ?ROS: Negative, with the exception of above mentioned in HPI ? ? ?Objective:  ?BP 128/82   Pulse 89   Temp 98.2 ?F (36.8 ?C) (Oral)   Ht 5\' 5"  (1.651 m)   Wt 203 lb (92.1 kg)   SpO2 98%   BMI 33.78 kg/m?  ?Body mass index is 33.78 kg/m?Marland Kitchen ?Physical Exam ?Vitals and nursing note reviewed.  ?Constitutional:   ?   General: She is not in acute distress. ?   Appearance:  Normal appearance. She is not ill-appearing, toxic-appearing or diaphoretic.  ?HENT:  ?   Head: Normocephalic and atraumatic.  ?   Mouth/Throat:  ?   Mouth:  Mucous membranes are moist.  ?Eyes:  ?   General: No scleral icterus.    ?   Right eye: No discharge.     ?   Left eye: No discharge.  ?   Extraocular Movements: Extraocular movements intact.  ?   Conjunctiva/sclera: Conjunctivae normal.  ?   Pupils: Pupils are equal, round, and reactive to light.  ?Cardiovascular:  ?   Rate and Rhythm: Normal rate and regular rhythm.  ?Pulmonary:  ?   Effort: Pulmonary effort is normal. No respiratory distress.  ?   Breath sounds: Normal breath sounds. No wheezing, rhonchi or rales.  ?Musculoskeletal:  ?   Cervical back: Neck supple. No tenderness.  ?Lymphadenopathy:  ?   Cervical: No cervical adenopathy.  ?Skin: ?   General: Skin is warm and dry.  ?   Coloration: Skin is not jaundiced or pale.  ?   Findings: No erythema or rash.  ?Neurological:  ?   Mental Status: She is alert and oriented to person, place, and time. Mental status is at baseline.  ?   Motor: No weakness.  ?   Gait: Gait normal.  ?Psychiatric:     ?   Mood and Affect: Mood normal.     ?   Behavior: Behavior normal.     ?   Thought Content: Thought content normal.     ?   Judgment: Judgment normal.  ? ? ? ?No results found. ?No results found. ?Results for orders placed or performed in visit on 03/01/22 (from the past 24 hour(s))  ?Vitamin D (25 hydroxy)     Status: Abnormal  ? Collection Time: 03/01/22  3:12 PM  ?Result Value Ref Range  ? Vit D, 25-Hydroxy 13 (L) 30 - 100 ng/mL  ?Uric acid     Status: None  ? Collection Time: 03/01/22  3:12 PM  ?Result Value Ref Range  ? Uric Acid, Serum 6.1 2.5 - 7.0 mg/dL  ? ? ?Assessment/Plan: ?Maura Hiott is a 36 y.o. female present for OV for  ?Vitamin D deficiency/malabsorption syndrome/B12 deficiency/B1 deficiency/functional diarrhea ?Continue vitamin D 1000 units daily, B12 1000 mcg daily and thiamine OTC if not  covered by insurance. ?- VITAMIN D 25 Hydroxy (Vit-D Deficiency, Fractures) ?No longer needing to Questran ? ?Mild recurrent major depression (HCC)/Anxiety ?Stable. ?Continue Effexor 75 mg daily. ?Continue Seroquel 2

## 2022-03-02 ENCOUNTER — Encounter: Payer: Self-pay | Admitting: Family Medicine

## 2022-03-02 ENCOUNTER — Telehealth: Payer: Self-pay | Admitting: Family Medicine

## 2022-03-02 DIAGNOSIS — M109 Gout, unspecified: Secondary | ICD-10-CM | POA: Insufficient documentation

## 2022-03-02 LAB — VITAMIN D 25 HYDROXY (VIT D DEFICIENCY, FRACTURES): Vit D, 25-Hydroxy: 13 ng/mL — ABNORMAL LOW (ref 30–100)

## 2022-03-02 LAB — URIC ACID: Uric Acid, Serum: 6.1 mg/dL (ref 2.5–7.0)

## 2022-03-02 MED ORDER — VITAMIN D (ERGOCALCIFEROL) 1.25 MG (50000 UNIT) PO CAPS: 50000.0000 [IU] | ORAL_CAPSULE | ORAL | 0 refills | Status: DC

## 2022-03-02 MED ORDER — QUETIAPINE FUMARATE 25 MG PO TABS: 25.0000 mg | ORAL_TABLET | Freq: Every day | ORAL | 5 refills | Status: DC

## 2022-03-02 MED ORDER — ALLOPURINOL 100 MG PO TABS: 100.0000 mg | ORAL_TABLET | Freq: Every day | ORAL | 6 refills | Status: DC

## 2022-03-02 NOTE — Telephone Encounter (Signed)
Please inform patient ?Her uric acid levels are normal, I have refilled her allopurinol at the same dose for her to continue daily. ?Her vitamin D levels are now much better at 13.  Our goal is for her to get these levels up to 40-50 for adequate bone health.  I have called in high-dose once weekly vitamin D for her to take with food for 12 weeks.  She should also start over-the-counter vitamin D3 2000 units daily, skipping OTC dose on day of the high-dose.  After high doses completed in 12 weeks OTC vitamin D is taken every day indefinitely to maintain levels. ?

## 2022-03-02 NOTE — Telephone Encounter (Signed)
Spoke with pt regarding labs and instructions.   

## 2022-06-05 ENCOUNTER — Encounter: Payer: Self-pay | Admitting: Family Medicine

## 2022-06-12 ENCOUNTER — Ambulatory Visit: Payer: 59 | Admitting: Family Medicine

## 2022-06-12 NOTE — Progress Notes (Deleted)
Frances Powell , 1986/03/28, 36 y.o., female MRN: [id number] Patient Care Team    Relationship Specialty Notifications Start End  Natalia Leatherwood, DO PCP - General Family Medicine  09/24/19   Tarry Kos, MD Attending Physician Orthopedic Surgery  09/24/19     No chief complaint on file.    Subjective: Pt presents for an OV with complaints of *** of *** duration.  Associated symptoms include ***.  Pt has tried *** to ease their symptoms.      03/01/2022    2:59 PM 09/08/2021   11:29 AM 10/19/2020    2:44 PM 11/25/2019    2:02 PM 09/24/2019   11:09 AM  Depression screen PHQ 2/9  Decreased Interest 1 2 3  0 3  Down, Depressed, Hopeless 0 1 3 1 2   PHQ - 2 Score 1 3 6 1 5   Altered sleeping 0 3 3 1 3   Tired, decreased energy 1 2 3 2 3   Change in appetite 2 0 0 1 1  Feeling bad or failure about yourself  1 1 3  0 3  Trouble concentrating 1 1 2 2 1   Moving slowly or fidgety/restless 0 0 3 0 2  Suicidal thoughts 0 0 1 0 0  PHQ-9 Score 6 10 21 7 18   Difficult doing work/chores    Very difficult Very difficult    No Known Allergies Social History   Social History Narrative   Marital status/children/pets: married   Education/employment: HS diploma- works as a .    Safety:      -smoke alarm in the home:Yes     - wears seatbelt: Yes     - Feels safe in their relationships: Yes   Past Medical History:  Diagnosis Date  . Alcohol abuse   . Axillary abscess 08/24/2021  . Depression   . 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 Migraines   . Toe fracture 05/11/2021   Past Surgical History:  Procedure Laterality Date  . APPENDECTOMY  2010   Family History  Problem Relation Age of Onset  . Arthritis Mother   . Depression Mother   . Lupus Mother   . Hypertension Mother   . Kidney disease Mother   . Miscarriages / Mother   . Leukemia Father   . Early death Father   . Kidney disease Maternal Grandmother   . Cancer Maternal Grandfather   . Heart  attack Paternal Grandfather   . Early death Sister   . Heart disease Sister   . Cervical cancer Sister    Allergies as of 06/12/2022   No Known Allergies      Medication List        Accurate as of June 12, 2022  1:07 PM. If you have any questions, ask your nurse or doctor.          allopurinol 100 MG tablet Commonly known as: ZYLOPRIM Take 1 tablet (100 mg total) by mouth daily.   losartan 100 MG tablet Commonly known as: Cozaar Take 1 tablet (100 mg total) by mouth daily.   metoprolol succinate 50 MG 24 hr tablet Commonly known as: TOPROL-XL Take 1 tablet (50 mg total) by mouth daily.   QUEtiapine 25 MG tablet Commonly known as: SEROquel Take 1 tablet (25 mg total) by mouth at bedtime.   thiamine 100 MG tablet Take 1 tablet (100 mg total) by mouth daily.   vitamin B-12 1000 MCG tablet Commonly known as: CYANOCOBALAMIN Take 1 tablet (  1,000 mcg total) by mouth daily.   Vitamin D (Cholecalciferol) 25 MCG (1000 UT) Tabs Take 1,000 Units by mouth daily. 1 tab daily, except for the day high-dose vitamin D tab is taken   Vitamin D (Ergocalciferol) 1.25 MG (50000 UNIT) Caps capsule Commonly known as: DRISDOL Take 1 capsule (50,000 Units total) by mouth every 7 (seven) days.        All past medical history, surgical history, allergies, family history, immunizations andmedications were updated in the EMR today and reviewed under the history and medication portions of their EMR.     ROS Negative, with the exception of above mentioned in HPI   Objective:  There were no vitals taken for this visit. There is no height or weight on file to calculate BMI.  Physical Exam   No results found. No results found. No results found for this or any previous visit (from the past 24 hour(s)).  Assessment/Plan: [name] is a 36 y.o. female present for OV for  *** Reviewed expectations re: course of current medical issues. Discussed self-management of  symptoms. Outlined signs and symptoms indicating need for more acute intervention. Patient verbalized understanding and all questions were answered. Patient received an After-Visit Summary.    No orders of the defined types were placed in this encounter.  No orders of the defined types were placed in this encounter.  Referral Orders  No referral(s) requested today     Note is dictated utilizing voice recognition software. Although note has been proof read prior to signing, occasional typographical errors still can be missed. If any questions arise, please do not hesitate to call for verification.   electronically signed by:  Felix Pacini, DO  [facility] - [place]

## 2022-06-13 ENCOUNTER — Encounter: Payer: Self-pay | Admitting: Family Medicine

## 2022-07-10 ENCOUNTER — Other Ambulatory Visit: Payer: Self-pay | Admitting: Family Medicine

## 2022-07-21 DIAGNOSIS — R569 Unspecified convulsions: Secondary | ICD-10-CM | POA: Diagnosis not present

## 2022-08-17 DIAGNOSIS — R569 Unspecified convulsions: Secondary | ICD-10-CM | POA: Diagnosis not present

## 2022-08-18 ENCOUNTER — Encounter: Payer: Self-pay | Admitting: Family Medicine

## 2022-08-18 ENCOUNTER — Ambulatory Visit: Payer: BC Managed Care – PPO | Admitting: Family Medicine

## 2022-08-18 ENCOUNTER — Telehealth: Payer: Self-pay | Admitting: Family Medicine

## 2022-08-18 VITALS — BP 132/87 | HR 77 | Temp 97.9°F | Ht 65.0 in | Wt 206.0 lb

## 2022-08-18 DIAGNOSIS — R002 Palpitations: Secondary | ICD-10-CM

## 2022-08-18 DIAGNOSIS — F330 Major depressive disorder, recurrent, mild: Secondary | ICD-10-CM

## 2022-08-18 DIAGNOSIS — R7989 Other specified abnormal findings of blood chemistry: Secondary | ICD-10-CM

## 2022-08-18 DIAGNOSIS — M1A9XX0 Chronic gout, unspecified, without tophus (tophi): Secondary | ICD-10-CM

## 2022-08-18 DIAGNOSIS — I10 Essential (primary) hypertension: Secondary | ICD-10-CM | POA: Diagnosis not present

## 2022-08-18 DIAGNOSIS — F419 Anxiety disorder, unspecified: Secondary | ICD-10-CM

## 2022-08-18 DIAGNOSIS — E538 Deficiency of other specified B group vitamins: Secondary | ICD-10-CM | POA: Diagnosis not present

## 2022-08-18 DIAGNOSIS — F1020 Alcohol dependence, uncomplicated: Secondary | ICD-10-CM

## 2022-08-18 DIAGNOSIS — E559 Vitamin D deficiency, unspecified: Secondary | ICD-10-CM

## 2022-08-18 LAB — VITAMIN D 25 HYDROXY (VIT D DEFICIENCY, FRACTURES): VITD: 21.38 ng/mL — ABNORMAL LOW (ref 30.00–100.00)

## 2022-08-18 LAB — VITAMIN B12: Vitamin B-12: 229 pg/mL (ref 211–911)

## 2022-08-18 MED ORDER — VITAMIN B-12 1000 MCG PO TABS
1000.0000 ug | ORAL_TABLET | Freq: Every day | ORAL | 11 refills | Status: DC
Start: 2022-08-18 — End: 2022-10-16

## 2022-08-18 MED ORDER — METOPROLOL SUCCINATE ER 50 MG PO TB24: 50.0000 mg | ORAL_TABLET | Freq: Every day | ORAL | 5 refills | Status: DC

## 2022-08-18 MED ORDER — VITAMIN D (ERGOCALCIFEROL) 1.25 MG (50000 UNIT) PO CAPS: 50000.0000 [IU] | ORAL_CAPSULE | ORAL | 0 refills | Status: DC

## 2022-08-18 MED ORDER — VITAMIN D (CHOLECALCIFEROL) 25 MCG (1000 UT) PO TABS: 1000.0000 [IU] | ORAL_TABLET | Freq: Every day | ORAL | 11 refills | Status: DC

## 2022-08-18 MED ORDER — THIAMINE HCL 100 MG PO TABS: 100.0000 mg | ORAL_TABLET | Freq: Every day | ORAL | 11 refills | Status: DC

## 2022-08-18 MED ORDER — ALLOPURINOL 100 MG PO TABS: 100.0000 mg | ORAL_TABLET | Freq: Every day | ORAL | 6 refills | Status: DC

## 2022-08-18 MED ORDER — QUETIAPINE FUMARATE 25 MG PO TABS: 25.0000 mg | ORAL_TABLET | Freq: Every day | ORAL | 5 refills | Status: DC

## 2022-08-18 MED ORDER — LOSARTAN POTASSIUM 100 MG PO TABS
100.0000 mg | ORAL_TABLET | Freq: Every day | ORAL | 5 refills | Status: DC
Start: 2022-08-18 — End: 2022-10-16

## 2022-08-18 MED ORDER — VITAMIN D (ERGOCALCIFEROL) 1.25 MG (50000 UNIT) PO CAPS: 50000.0000 [IU] | ORAL_CAPSULE | ORAL | 3 refills | Status: DC

## 2022-08-18 NOTE — Progress Notes (Signed)
Frances Powell , [birth year], 36 y.o., female MRN: [id number] Patient Care Team    Relationship Specialty Notifications Start End  Natalia Leatherwood, DO PCP - General Family Medicine  09/24/19   Tarry Kos, MD Attending Physician Orthopedic Surgery  09/24/19     Chief Complaint  Patient presents with   Hypertension    [facility]; pt is fasting     Subjective: Pt presents for an OV for follow-up on her [facility] Hypertension/palpitations/obesity (BMI 30-39.9) Pt reports compliance with metoprolol 25 mg QD and  losartan 100 mg QD. Patient denies chest pain, shortness of breath, dizziness or lower extremity edema.   Malabsorption syndrome/Functional diarrhea/vit d def/b12 def/b1 def She has continued her supplementations    Mild recurrent major depression (HCC)/Anxiety Patient reports she is feeling well on Effexor 75 mg daily and Seroquel 25 mg nightly.  She is no longer taking the gabapentin.     Alcohol dependence (HCC)/Elevated LFTs/Highly echogenic liver on ultrasound Reports she is feeling better and she is avoiding alcohol use.  She did an episode where she was seen in the ED secondary to consuming alcohol.  She needed to be seen by neurology to rule out possible seizure, she just got the report today that she had a normal EEG.  She has never had seizures in the past and she has not had any additional episodes since ED visit.  Gout: Patient reports she is compliant allopurinol 100 mg daily and has not had a flare since starting this medication.   No LMP recorded. (Menstrual status: Irregular Periods).      08/18/2022   11:01 AM 03/01/2022    2:59 PM 09/08/2021   11:29 AM 10/19/2020    2:44 PM 11/25/2019    2:02 PM  Depression screen PHQ 2/9  Decreased Interest 1 1 2 3  0  Down, Depressed, Hopeless 1 0 1 3 1   PHQ - 2 Score 2 1 3 6 1   Altered sleeping 1 0 3 3 1   Tired, decreased energy 2 1 2 3 2   Change in appetite 1 2 0 0 1  Feeling bad or failure about yourself  1 1 1 3  0   Trouble concentrating 0 1 1 2 2   Moving slowly or fidgety/restless 0 0 0 3 0  Suicidal thoughts 0 0 0 1 0  PHQ-9 Score 7 6 10 21 7   Difficult doing work/chores     Very difficult      08/18/2022   11:01 AM 03/01/2022    2:59 PM 09/08/2021   11:41 AM 10/19/2020    2:46 PM  GAD 7 : Generalized Anxiety Score  Nervous, Anxious, on Edge 2 2 3 3   Control/stop worrying 2 1 3 3   Worry too much - different things 2 1 3 3   Trouble relaxing 1 1 2 3   Restless 0 0 0 0  Easily annoyed or irritable 1 1 1 3   Afraid - awful might happen 1 1 1 1   Total GAD 7 Score 9 7 13 16     No Known Allergies Social History   Social History Narrative   Marital status/children/pets: married   Education/employment: HS diploma- works as a .    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   Migraines    Toe fracture 05/11/2021   Past Surgical History:  Procedure Laterality Date   APPENDECTOMY  2010   Family History  Problem Relation Age of Onset   Arthritis Mother    Depression Mother    Lupus Mother    Hypertension Mother    Kidney disease Mother    Miscarriages / [place] Mother    Leukemia Father    Early death Father    Kidney disease Maternal Grandmother    Cancer Maternal Grandfather    Heart attack Paternal Grandfather    Early death Sister    Heart disease Sister    Cervical cancer Sister    Allergies as of 08/18/2022   No Known Allergies      Medication List        Accurate as of August 18, 2022  4:59 PM. If you have any questions, ask your nurse or doctor.          allopurinol 100 MG tablet Commonly known as: ZYLOPRIM Take 1 tablet (100 mg total) by mouth daily.   cyanocobalamin 1000 MCG tablet Commonly known as: VITAMIN B12 Take 1 tablet (1,000 mcg  total) by mouth daily.   losartan 100 MG tablet Commonly known as: Cozaar Take 1 tablet (100 mg total) by mouth daily.   metoprolol succinate 50 MG 24 hr tablet Commonly known as: TOPROL-XL Take 1 tablet (50 mg total) by mouth daily.   QUEtiapine 25 MG tablet Commonly known as: SEROquel Take 1 tablet (25 mg total) by mouth at bedtime.   thiamine 100 MG tablet Commonly known as: VITAMIN B1 Take 1 tablet (100 mg total) by mouth daily.   Vitamin D (Cholecalciferol) 25 MCG (1000 UT) Tabs Take 1,000 Units by mouth daily. 1 tab daily, except for the day high-dose vitamin D tab is taken   Vitamin D (Ergocalciferol) 1.25 MG (50000 UNIT) Caps capsule Commonly known as: DRISDOL Take 1 capsule (50,000 Units total) by mouth every 7 (seven) days.        All past medical history, surgical history, allergies, family history, immunizations andmedications were updated in the EMR today and reviewed under the history and medication portions of their EMR.     ROS: Negative, with the exception of above mentioned in HPI   Objective:  BP 132/87   Pulse 77   Temp 97.9 F (36.6 C) (Oral)   Ht 5\' 5"  (1.651 m)   Wt 206 lb (93.4 kg)   SpO2 99%   BMI 34.28 kg/m  Body mass index is 34.28 kg/m. Physical Exam Vitals and nursing note reviewed.  Constitutional:      General: She is not in acute distress.    Appearance: Normal appearance. She is not ill-appearing, toxic-appearing or diaphoretic.  HENT:     Head: Normocephalic and atraumatic.  Eyes:     General: No scleral icterus.       Right eye: No discharge.        Left eye: No discharge.     Extraocular Movements: Extraocular movements intact.     Conjunctiva/sclera: Conjunctivae normal.     Pupils: Pupils are equal, round, and reactive to light.  Cardiovascular:     Rate and Rhythm: Normal rate and regular rhythm.     Heart sounds: No murmur heard. Pulmonary:     Effort: Pulmonary effort is normal. No respiratory distress.      Breath sounds: Normal breath sounds. No wheezing, rhonchi  or rales.  Musculoskeletal:     Cervical back: Neck supple. No tenderness.     Right lower leg: No edema.     Left lower leg: No edema.  Lymphadenopathy:     Cervical: No cervical adenopathy.  Skin:    General: Skin is warm and dry.     Coloration: Skin is not jaundiced or pale.     Findings: No erythema or rash.  Neurological:     Mental Status: She is alert and oriented to person, place, and time. Mental status is at baseline.     Motor: No weakness.     Gait: Gait normal.  Psychiatric:        Mood and Affect: Mood normal.        Behavior: Behavior normal.        Thought Content: Thought content normal.        Judgment: Judgment normal.     No results found. No results found. Results for orders placed or performed in visit on 08/18/22 (from the past 24 hour(s))  Vitamin D (25 hydroxy)     Status: Abnormal   Collection Time: 08/18/22 11:18 AM  Result Value Ref Range   VITD 21.38 (L) 30.00 - 100.00 ng/mL  B12     Status: None   Collection Time: 08/18/22 11:18 AM  Result Value Ref Range   Vitamin B-12 229 211 - 911 pg/mL     Assessment/Plan: Frances Powell is a 36 y.o. female present for OV for [facility] Vitamin D deficiency/malabsorption syndrome/B12 deficiency/B1 deficiency/functional diarrhea Continue vitamin D 1000 units daily, B12 1000 mcg daily and thiamine OTC if not covered by insurance. - VITAMIN D 25 Hydroxy (Vit-D Deficiency, Fractures), B12 levels collected today No longer needing to Questran  Mild recurrent major depression (HCC)/Anxiety Stable Continue Effexor 75 mg daily. Continue Seroquel 25 mg qhs Tapered off gabapentin 300/300/600> discontinued  Essential hypertension/obesity/palpitations Stable Continue metoprolol 50 mg daily Continue losartan 100 mg daily Follow-up 5.5 months on chronic conditions unless labs indicate need to bring her back sooner.  alcohol dependence (HCC)/elevated  LFTs No longer requiring Depade or gabapentin. Feeling better avoiding alcohol-only had 1 setback. Continue thiamine, B12 and vitamin D- Hepatitis B series completed B12 and vitamin D collected today  Gout: stable Continue  allopurinol 100 mg daily   Return in about 24 weeks (around 02/02/2023) for Routine chronic condition follow-up.  Reviewed expectations re: course of current medical issues. Discussed self-management of symptoms. Outlined signs and symptoms indicating need for more acute intervention. Patient verbalized understanding and all questions were answered. Patient received an After-Visit Summary.    Orders Placed This Encounter  Procedures   Vitamin D (25 hydroxy)   B12    Meds ordered this encounter  Medications   allopurinol (ZYLOPRIM) 100 MG tablet    Sig: Take 1 tablet (100 mg total) by mouth daily.    Dispense:  30 tablet    Refill:  6   losartan (COZAAR) 100 MG tablet    Sig: Take 1 tablet (100 mg total) by mouth daily.    Dispense:  30 tablet    Refill:  5   metoprolol succinate (TOPROL-XL) 50 MG 24 hr tablet    Sig: Take 1 tablet (50 mg total) by mouth daily.    Dispense:  30 tablet    Refill:  5   QUEtiapine (SEROQUEL) 25 MG tablet    Sig: Take 1 tablet (25 mg total) by mouth at bedtime.    Dispense:  30 tablet    Refill:  5   thiamine (VITAMIN B1) 100 MG tablet    Sig: Take 1 tablet (100 mg total) by mouth daily.    Dispense:  30 tablet    Refill:  11   cyanocobalamin (VITAMIN B12) 1000 MCG tablet    Sig: Take 1 tablet (1,000 mcg total) by mouth daily.    Dispense:  30 tablet    Refill:  11   Vitamin D, Cholecalciferol, 25 MCG (1000 UT) TABS    Sig: Take 1,000 Units by mouth daily. 1 tab daily, except for the day high-dose vitamin D tab is taken    Dispense:  30 tablet    Refill:  11    Referral Orders  No referral(s) requested today     Note is dictated utilizing voice recognition software. Although note has been proof read prior  to signing, occasional typographical errors still can be missed. If any questions arise, please do not hesitate to call for verification.   electronically signed by:  Felix Pacini, DO  [facility] - [place]

## 2022-08-18 NOTE — Telephone Encounter (Signed)
Call patient Both her B12 and vitamin D are low. I have called in the high-dose vitamin D once weekly For B12, I would recommend she come in for injections every 4 weeks.  It is still significantly low and she reported she had been taking the B12 sublingual daily. If she absolutely does not want to have the injections, then I would encourage her to increase her B12 to 2500 mcg sublingual daily.

## 2022-08-18 NOTE — Patient Instructions (Signed)
Return in about 24 weeks (around 02/02/2023) for Routine chronic condition follow-up.        Great to see you today.  I have refilled the medication(s) we provide.   If labs were collected, we will inform you of lab results once received either by echart message or telephone call.   - echart message- for normal results that have been seen by the patient already.   - telephone call: abnormal results or if patient has not viewed results in their echart.  

## 2022-08-22 NOTE — Telephone Encounter (Signed)
Spoke with patient regarding results/recommendations.  

## 2022-08-23 ENCOUNTER — Ambulatory Visit (INDEPENDENT_AMBULATORY_CARE_PROVIDER_SITE_OTHER): Payer: BC Managed Care – PPO

## 2022-08-23 DIAGNOSIS — E538 Deficiency of other specified B group vitamins: Secondary | ICD-10-CM

## 2022-08-23 MED ORDER — CYANOCOBALAMIN 1000 MCG/ML IJ SOLN
1000.0000 ug | Freq: Once | INTRAMUSCULAR | Status: AC
  Administered 2022-08-23: 1000 ug via INTRAMUSCULAR

## 2022-08-23 NOTE — Progress Notes (Signed)
Dewayne Jurek is a 36 y.o. female presents to the office today for b12 injections, per physician's orders.Original order:  " I would recommend come in for injections every 4 weeks. Cyanocobalamin 1000 mcg,  Injection was administered IM right deltoid today. Patient tolerated injection. Patient due for follow up labs/provider appt: No. Date due: n/a, appt made No Patient next injection due: 4 weeks, appt made Yes  Emilee Hero

## 2022-08-30 ENCOUNTER — Ambulatory Visit: Payer: BC Managed Care – PPO

## 2022-09-08 ENCOUNTER — Ambulatory Visit (INDEPENDENT_AMBULATORY_CARE_PROVIDER_SITE_OTHER): Payer: BC Managed Care – PPO

## 2022-09-08 DIAGNOSIS — E538 Deficiency of other specified B group vitamins: Secondary | ICD-10-CM | POA: Diagnosis not present

## 2022-09-08 MED ORDER — CYANOCOBALAMIN 1000 MCG/ML IJ SOLN
1000.0000 ug | Freq: Once | INTRAMUSCULAR | Status: AC
  Administered 2022-09-08: 1000 ug via INTRAMUSCULAR

## 2022-09-08 NOTE — Progress Notes (Signed)
Frances Powell is a 36 y.o. female presents to the office today for b12 injections, per physician's orders.Original order:  " I would recommend come in for injections every 4 weeks. Cyanocobalamin 1000 mcg,  Injection was administered IM right deltoid today. Patient tolerated injection. Patient due for follow up labs/provider appt: No. Date due: n/a, appt made No Patient next injection due: 4 weeks, appt made Yes

## 2022-09-15 ENCOUNTER — Ambulatory Visit (INDEPENDENT_AMBULATORY_CARE_PROVIDER_SITE_OTHER): Payer: BC Managed Care – PPO

## 2022-09-15 DIAGNOSIS — E538 Deficiency of other specified B group vitamins: Secondary | ICD-10-CM | POA: Diagnosis not present

## 2022-09-15 MED ORDER — CYANOCOBALAMIN 1000 MCG/ML IJ SOLN
1000.0000 ug | Freq: Once | INTRAMUSCULAR | Status: AC
  Administered 2022-09-15: 1000 ug via INTRAMUSCULAR

## 2022-09-15 NOTE — Progress Notes (Signed)
Pt here for monthly B12 injection per Dr.Kuneff  B12 1063mcg given IM, and pt tolerated injection well.  Next B12 injection scheduled for 09/22/22.

## 2022-09-22 ENCOUNTER — Ambulatory Visit (INDEPENDENT_AMBULATORY_CARE_PROVIDER_SITE_OTHER): Payer: BC Managed Care – PPO

## 2022-09-22 DIAGNOSIS — E538 Deficiency of other specified B group vitamins: Secondary | ICD-10-CM

## 2022-09-22 MED ORDER — CYANOCOBALAMIN 1000 MCG/ML IJ SOLN
1000.0000 ug | Freq: Once | INTRAMUSCULAR | Status: AC
  Administered 2022-09-22: 1000 ug via INTRAMUSCULAR

## 2022-09-22 NOTE — Progress Notes (Signed)
Pt here for B12 injection per Dr.Kuneff  B12 1057mcg given IM, and pt tolerated injection well.  Next B12 injection not scheduled

## 2022-10-16 ENCOUNTER — Encounter: Payer: Self-pay | Admitting: Family Medicine

## 2022-10-16 ENCOUNTER — Other Ambulatory Visit: Payer: Self-pay

## 2022-10-16 MED ORDER — ALLOPURINOL 100 MG PO TABS: 100.0000 mg | ORAL_TABLET | Freq: Every day | ORAL | 0 refills | Status: DC

## 2022-10-16 MED ORDER — VITAMIN B-12 1000 MCG PO TABS: 1000.0000 ug | ORAL_TABLET | Freq: Every day | ORAL | 2 refills | Status: AC

## 2022-10-16 MED ORDER — VITAMIN D (CHOLECALCIFEROL) 25 MCG (1000 UT) PO TABS: 1000.0000 [IU] | ORAL_TABLET | Freq: Every day | ORAL | 2 refills | Status: AC

## 2022-10-16 MED ORDER — LOSARTAN POTASSIUM 100 MG PO TABS: 100.0000 mg | ORAL_TABLET | Freq: Every day | ORAL | 0 refills | Status: DC

## 2022-10-16 MED ORDER — THIAMINE HCL 100 MG PO TABS: 100.0000 mg | ORAL_TABLET | Freq: Every day | ORAL | 2 refills | Status: AC

## 2022-10-16 MED ORDER — QUETIAPINE FUMARATE 25 MG PO TABS: 25.0000 mg | ORAL_TABLET | Freq: Every day | ORAL | 0 refills | Status: DC

## 2022-10-16 MED ORDER — METOPROLOL SUCCINATE ER 50 MG PO TB24: 50.0000 mg | ORAL_TABLET | Freq: Every day | ORAL | 0 refills | Status: DC

## 2023-02-02 ENCOUNTER — Ambulatory Visit: Payer: BC Managed Care – PPO | Admitting: Family Medicine

## 2023-02-02 ENCOUNTER — Encounter: Payer: Self-pay | Admitting: Family Medicine

## 2023-02-02 VITALS — BP 137/83 | HR 92 | Temp 97.7°F | Wt 202.8 lb

## 2023-02-02 DIAGNOSIS — K909 Intestinal malabsorption, unspecified: Secondary | ICD-10-CM

## 2023-02-02 DIAGNOSIS — F330 Major depressive disorder, recurrent, mild: Secondary | ICD-10-CM

## 2023-02-02 DIAGNOSIS — I10 Essential (primary) hypertension: Secondary | ICD-10-CM | POA: Diagnosis not present

## 2023-02-02 DIAGNOSIS — M1A9XX0 Chronic gout, unspecified, without tophus (tophi): Secondary | ICD-10-CM

## 2023-02-02 DIAGNOSIS — E669 Obesity, unspecified: Secondary | ICD-10-CM

## 2023-02-02 DIAGNOSIS — F1020 Alcohol dependence, uncomplicated: Secondary | ICD-10-CM

## 2023-02-02 DIAGNOSIS — F419 Anxiety disorder, unspecified: Secondary | ICD-10-CM

## 2023-02-02 DIAGNOSIS — R002 Palpitations: Secondary | ICD-10-CM

## 2023-02-02 DIAGNOSIS — E538 Deficiency of other specified B group vitamins: Secondary | ICD-10-CM

## 2023-02-02 MED ORDER — METOPROLOL SUCCINATE ER 50 MG PO TB24: 50.0000 mg | ORAL_TABLET | Freq: Every day | ORAL | 1 refills | Status: AC

## 2023-02-02 MED ORDER — GABAPENTIN 300 MG PO CAPS: 300.0000 mg | ORAL_CAPSULE | Freq: Three times a day (TID) | ORAL | 5 refills | Status: AC

## 2023-02-02 MED ORDER — LOSARTAN POTASSIUM 100 MG PO TABS: 100.0000 mg | ORAL_TABLET | Freq: Every day | ORAL | 1 refills | Status: AC

## 2023-02-02 MED ORDER — ALLOPURINOL 100 MG PO TABS: 100.0000 mg | ORAL_TABLET | Freq: Every day | ORAL | 1 refills | Status: AC

## 2023-02-02 MED ORDER — QUETIAPINE FUMARATE 25 MG PO TABS: 25.0000 mg | ORAL_TABLET | Freq: Every day | ORAL | 1 refills | Status: AC

## 2023-02-02 NOTE — Patient Instructions (Addendum)
Return in about 24 weeks (around 07/20/2023) for Routine chronic condition follow-up.        Great to see you today.  I have refilled the medication(s) we provide.   If labs were collected, we will inform you of lab results once received either by echart message or telephone call.   - echart message- for normal results that have been seen by the patient already.   - telephone call: abnormal results or if patient has not viewed results in their echart.

## 2023-02-02 NOTE — Progress Notes (Signed)
Frances Powell , 01-Mar-1986, 37 y.o., female MRN: [id number] Patient Care Team    Relationship Specialty Notifications Start End  Ma Hillock, DO PCP - General Family Medicine  09/24/19   Leandrew Koyanagi, MD Attending Physician Orthopedic Surgery  09/24/19     Chief Complaint  Patient presents with   Hypertension     Subjective: Pt presents for an OV for follow-up on her Chronic Conditions/illness Management  Hypertension/palpitations/obesity (BMI 30-39.9) Pt reports compliance with metoprolol 25 mg QD and  losartan 100 mg QD.  Patient denies chest pain, shortness of breath, dizziness or lower extremity edema.   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   Alcohol dependence (HCC)/Elevated LFTs/Highly echogenic liver on ultrasound Reports she is feeling better and she is avoiding alcohol use.  She did an episode where she was seen in the ED secondary to consuming alcohol.  She needed to be seen by neurology to rule out possible seizure, she just got the report today that she had a normal EEG.  She has never had seizures in the past and she has not had any additional episodes since ED visit.  Gout: Patient reports she is compliant allopurinol 100 mg daily and has not had a flare since starting this medication.    Patient's last menstrual period was 12/18/2022.      02/02/2023    9:00 AM 08/18/2022   11:01 AM 03/01/2022    2:59 PM 09/08/2021   11:29 AM 10/19/2020    2:44 PM  Depression screen PHQ 2/9  Decreased Interest '1 1 1 2 3  '$ Down, Depressed, Hopeless 1 1 0 1 3  PHQ - 2 Score '2 2 1 3 6  '$ Altered sleeping 1 1 0 3 3  Tired, decreased energy '2 2 1 2 3  '$ Change in appetite '1 1 2 '$ 0 0  Feeling bad or failure about yourself  '1 1 1 1 3  '$ Trouble  concentrating 0 0 '1 1 2  '$ Moving slowly or fidgety/restless 0 0 0 0 3  Suicidal thoughts 0 0 0 0 1  PHQ-9 Score '7 7 6 10 21      '$ 02/02/2023    9:01 AM 08/18/2022   11:01 AM 03/01/2022    2:59 PM 09/08/2021   11:41 AM  GAD 7 : Generalized Anxiety Score  Nervous, Anxious, on Edge '2 2 2 3  '$ Control/stop worrying '1 2 1 3  '$ Worry too much - different things '1 2 1 3  '$ Trouble relaxing '1 1 1 2  '$ Restless 0 0 0 0  Easily annoyed or irritable '1 1 1 1  '$ Afraid - awful might happen '1 1 1 1  '$ Total GAD 7 Score '7 9 7 13    '$ No Known Allergies Social History   Social History Narrative   Marital status/children/pets: married   Education/employment: 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    Safety:      -smoke alarm in the home:Yes     - wears seatbelt: Yes     - Feels safe in their relationships: Yes   Past Medical History:  Diagnosis Date   Alcohol abuse    Axillary abscess 08/24/2021   Depression    Functional diarrhea 05/25/2020   Migraines    Toe fracture 05/11/2021  Past Surgical History:  Procedure Laterality Date   APPENDECTOMY  2010   Family History  Problem Relation Age of Onset   Arthritis Mother    Depression Mother    Lupus Mother    Hypertension Mother    Kidney disease Mother    63 / [place] Mother    Leukemia Father    Early death Father    Kidney disease Maternal Grandmother    Cancer Maternal Grandfather    Heart attack Paternal Grandfather    Early death Sister    Heart disease Sister    Cervical cancer Sister    Allergies as of 02/02/2023   No Known Allergies      Medication List        Accurate as of February 02, 2023  9:07 AM. If you have any questions, ask your nurse or doctor.          STOP taking these medications    Vitamin D (Ergocalciferol) 1.25 MG (50000 UNIT) Caps capsule Commonly known as: DRISDOL Stopped by: Howard Pouch, DO       TAKE these medications    allopurinol 100 MG tablet Commonly known as: ZYLOPRIM Take 1 tablet  (100 mg total) by mouth daily.   cyanocobalamin 1000 MCG tablet Commonly known as: VITAMIN B12 Take 1 tablet (1,000 mcg total) by mouth daily.   gabapentin 300 MG capsule Commonly known as: Neurontin Take 1 capsule (300 mg total) by mouth 3 (three) times daily. Started by: Howard Pouch, DO   losartan 100 MG tablet Commonly known as: Cozaar Take 1 tablet (100 mg total) by mouth daily.   metoprolol succinate 50 MG 24 hr tablet Commonly known as: TOPROL-XL Take 1 tablet (50 mg total) by mouth daily.   QUEtiapine 25 MG tablet Commonly known as: SEROquel Take 1 tablet (25 mg total) by mouth at bedtime.   thiamine 100 MG tablet Commonly known as: VITAMIN B1 Take 1 tablet (100 mg total) by mouth daily.   Vitamin D (Cholecalciferol) 25 MCG (1000 UT) Tabs Take 1,000 Units by mouth daily. 1 tab daily, except for the day high-dose vitamin D tab is taken        All past medical history, surgical history, allergies, family history, immunizations andmedications were updated in the EMR today and reviewed under the history and medication portions of their EMR.     ROS: Negative, with the exception of above mentioned in HPI   Objective:  BP 137/83   Pulse 92   Temp 97.7 F (36.5 C)   Wt 202 lb 12.8 oz (92 kg)   LMP 12/18/2022   SpO2 100%   BMI 33.75 kg/m  Body mass index is 33.75 kg/m. Physical Exam Vitals and nursing note reviewed.  Constitutional:      General: She is not in acute distress.    Appearance: Normal appearance. She is not ill-appearing, toxic-appearing or diaphoretic.  HENT:     Head: Normocephalic and atraumatic.  Eyes:     General: No scleral icterus.       Right eye: No discharge.        Left eye: No discharge.     Extraocular Movements: Extraocular movements intact.     Conjunctiva/sclera: Conjunctivae normal.     Pupils: Pupils are equal, round, and reactive to light.  Cardiovascular:     Rate and Rhythm: Normal rate and regular rhythm.     Heart  sounds: No murmur heard. Pulmonary:     Effort: Pulmonary effort  is normal. No respiratory distress.     Breath sounds: Normal breath sounds. No wheezing, rhonchi or rales.  Musculoskeletal:     Right lower leg: No edema.     Left lower leg: No edema.  Skin:    General: Skin is warm and dry.     Coloration: Skin is not jaundiced or pale.     Findings: No erythema or rash.  Neurological:     Mental Status: She is alert and oriented to person, place, and time. Mental status is at baseline.     Motor: No weakness.     Gait: Gait normal.  Psychiatric:        Mood and Affect: Mood normal.        Behavior: Behavior normal.        Thought Content: Thought content normal.        Judgment: Judgment normal.     No results found. No results found. No results found for this or any previous visit (from the past 24 hour(s)).    Assessment/Plan: Koleen Gless is a 37 y.o. female present for OV for Chronic Conditions/illness Management Vitamin D deficiency/malabsorption syndrome/B12 deficiency/B1 deficiency/functional diarrhea Continue vitamin D 1000 units daily, B12 1000 mcg daily and thiamine OTC if not covered by insurance. - VITAMIN D 25 Hydroxy (Vit-D Deficiency, Fractures), B12 levels up-to-date No longer needing to [facility]  Mild recurrent major depression (HCC)/Anxiety Stable Continue Seroquel 25 mg qhs  Essential hypertension/obesity/palpitations Stable Continue metoprolol 50 mg daily Continue losartan 100 mg daily Reviewed cbc, cmp  from 06/2022 [facility]  alcohol dependence (HCC)/elevated LFTs No longer requiring Depade or gabapentin. Had a back step and is drinking daily again, but not as much.  Continue thiamine, B12 and vitamin D- Hepatitis B series completed B12 and vitamin D up-to-date Restart gaba 300 tid. She wants to try to quit all alcohol again. They are going to try to have a child   Gout: Stable Continue allopurinol 100 mg daily   Return in  about 24 weeks (around 07/20/2023) for Routine chronic condition follow-up.  Reviewed expectations re: course of current medical issues. Discussed self-management of symptoms. Outlined signs and symptoms indicating need for more acute intervention. Patient verbalized understanding and all questions were answered. Patient received an After-Visit Summary.    No orders of the defined types were placed in this encounter.   Meds ordered this encounter  Medications   allopurinol (ZYLOPRIM) 100 MG tablet    Sig: Take 1 tablet (100 mg total) by mouth daily.    Dispense:  90 tablet    Refill:  1   losartan (COZAAR) 100 MG tablet    Sig: Take 1 tablet (100 mg total) by mouth daily.    Dispense:  90 tablet    Refill:  1   metoprolol succinate (TOPROL-XL) 50 MG 24 hr tablet    Sig: Take 1 tablet (50 mg total) by mouth daily.    Dispense:  90 tablet    Refill:  1   QUEtiapine (SEROQUEL) 25 MG tablet    Sig: Take 1 tablet (25 mg total) by mouth at bedtime.    Dispense:  90 tablet    Refill:  1   gabapentin (NEURONTIN) 300 MG capsule    Sig: Take 1 capsule (300 mg total) by mouth 3 (three) times daily.    Dispense:  90 capsule    Refill:  5    Referral Orders  No referral(s) requested today  Note is dictated utilizing voice recognition software. Although note has been proof read prior to signing, occasional typographical errors still can be missed. If any questions arise, please do not hesitate to call for verification.   electronically signed by:  Howard Pouch, DO  [place]

## 2023-07-04 ENCOUNTER — Encounter (INDEPENDENT_AMBULATORY_CARE_PROVIDER_SITE_OTHER): Payer: Self-pay

## 2023-07-20 ENCOUNTER — Ambulatory Visit (INDEPENDENT_AMBULATORY_CARE_PROVIDER_SITE_OTHER): Payer: Medicaid Other | Admitting: Family Medicine

## 2023-07-20 DIAGNOSIS — E538 Deficiency of other specified B group vitamins: Secondary | ICD-10-CM

## 2023-07-20 DIAGNOSIS — F330 Major depressive disorder, recurrent, mild: Secondary | ICD-10-CM

## 2023-07-20 DIAGNOSIS — F419 Anxiety disorder, unspecified: Secondary | ICD-10-CM

## 2023-07-20 DIAGNOSIS — E519 Thiamine deficiency, unspecified: Secondary | ICD-10-CM

## 2023-07-20 DIAGNOSIS — Z91199 Patient's noncompliance with other medical treatment and regimen due to unspecified reason: Secondary | ICD-10-CM

## 2023-07-20 DIAGNOSIS — E559 Vitamin D deficiency, unspecified: Secondary | ICD-10-CM

## 2023-07-20 DIAGNOSIS — F1020 Alcohol dependence, uncomplicated: Secondary | ICD-10-CM

## 2023-07-20 DIAGNOSIS — I10 Essential (primary) hypertension: Secondary | ICD-10-CM

## 2023-07-20 DIAGNOSIS — M1A9XX0 Chronic gout, unspecified, without tophus (tophi): Secondary | ICD-10-CM

## 2023-07-20 DIAGNOSIS — R7989 Other specified abnormal findings of blood chemistry: Secondary | ICD-10-CM

## 2023-07-20 NOTE — Progress Notes (Signed)
      No show- fee and letter

## 2023-07-20 NOTE — Patient Instructions (Signed)

## 2023-10-17 ENCOUNTER — Encounter: Payer: Self-pay | Admitting: Family Medicine

## 2024-03-14 ENCOUNTER — Emergency Department (HOSPITAL_BASED_OUTPATIENT_CLINIC_OR_DEPARTMENT_OTHER)
Admission: EM | Admit: 2024-03-14 | Discharge: 2024-03-14 | Disposition: A | Discharge status: Home / Self Care | Attending: Emergency Medicine | Admitting: Emergency Medicine

## 2024-03-14 ENCOUNTER — Encounter (HOSPITAL_BASED_OUTPATIENT_CLINIC_OR_DEPARTMENT_OTHER): Payer: Self-pay

## 2024-03-14 ENCOUNTER — Other Ambulatory Visit: Payer: Self-pay

## 2024-03-14 ENCOUNTER — Emergency Department (HOSPITAL_BASED_OUTPATIENT_CLINIC_OR_DEPARTMENT_OTHER)

## 2024-03-14 DIAGNOSIS — Z79899 Other long term (current) drug therapy: Secondary | ICD-10-CM | POA: Diagnosis not present

## 2024-03-14 DIAGNOSIS — R4182 Altered mental status, unspecified: Secondary | ICD-10-CM | POA: Diagnosis present

## 2024-03-14 DIAGNOSIS — E860 Dehydration: Secondary | ICD-10-CM

## 2024-03-14 DIAGNOSIS — I10 Essential (primary) hypertension: Secondary | ICD-10-CM | POA: Diagnosis not present

## 2024-03-14 LAB — URINALYSIS, ROUTINE W REFLEX MICROSCOPIC
Bilirubin Urine: NEGATIVE
Glucose, UA: NEGATIVE mg/dL
Hgb urine dipstick: NEGATIVE
Ketones, ur: NEGATIVE mg/dL
Leukocytes,Ua: NEGATIVE
Nitrite: NEGATIVE
Protein, ur: NEGATIVE mg/dL
Specific Gravity, Urine: 1.015 (ref 1.005–1.030)
pH: 7.5 (ref 5.0–8.0)

## 2024-03-14 LAB — RAPID URINE DRUG SCREEN, HOSP PERFORMED
Amphetamines: NOT DETECTED
Barbiturates: NOT DETECTED
Benzodiazepines: NOT DETECTED
Cocaine: NOT DETECTED
Opiates: NOT DETECTED
Tetrahydrocannabinol: POSITIVE — AB

## 2024-03-14 LAB — HCG, SERUM, QUALITATIVE: Preg, Serum: NEGATIVE

## 2024-03-14 LAB — CBC WITH DIFFERENTIAL/PLATELET
Abs Immature Granulocytes: 0.04 10*3/uL (ref 0.00–0.07)
Basophils Absolute: 0.1 10*3/uL (ref 0.0–0.1)
Basophils Relative: 1 %
Eosinophils Absolute: 0 10*3/uL (ref 0.0–0.5)
Eosinophils Relative: 0 %
HCT: 39.2 % (ref 36.0–46.0)
Hemoglobin: 13.6 g/dL (ref 12.0–15.0)
Immature Granulocytes: 0 %
Lymphocytes Relative: 20 %
Lymphs Abs: 2 10*3/uL (ref 0.7–4.0)
MCH: 33 pg (ref 26.0–34.0)
MCHC: 34.7 g/dL (ref 30.0–36.0)
MCV: 95.1 fL (ref 80.0–100.0)
Monocytes Absolute: 0.5 10*3/uL (ref 0.1–1.0)
Monocytes Relative: 5 %
Neutro Abs: 7.6 10*3/uL (ref 1.7–7.7)
Neutrophils Relative %: 74 %
Platelets: 313 10*3/uL (ref 150–400)
RBC: 4.12 MIL/uL (ref 3.87–5.11)
RDW: 14.2 % (ref 11.5–15.5)
WBC: 10.2 10*3/uL (ref 4.0–10.5)
nRBC: 0 % (ref 0.0–0.2)

## 2024-03-14 LAB — COMPREHENSIVE METABOLIC PANEL WITH GFR
ALT: 27 U/L (ref 0–44)
AST: 31 U/L (ref 15–41)
Albumin: 4.3 g/dL (ref 3.5–5.0)
Alkaline Phosphatase: 51 U/L (ref 38–126)
Anion gap: 16 — ABNORMAL HIGH (ref 5–15)
BUN: 8 mg/dL (ref 6–20)
CO2: 24 mmol/L (ref 22–32)
Calcium: 10.4 mg/dL — ABNORMAL HIGH (ref 8.9–10.3)
Chloride: 105 mmol/L (ref 98–111)
Creatinine, Ser: 0.89 mg/dL (ref 0.44–1.00)
GFR, Estimated: >60 mL/min (ref >60)
Glucose, Bld: 127 mg/dL — ABNORMAL HIGH (ref 70–99)
Potassium: 3.9 mmol/L (ref 3.5–5.1)
Sodium: 145 mmol/L (ref 135–145)
Total Bilirubin: 0.5 mg/dL (ref 0.0–1.2)
Total Protein: 8.1 g/dL (ref 6.5–8.1)

## 2024-03-14 LAB — ETHANOL: Alcohol, Ethyl (B): <10 mg/dL (ref <10)

## 2024-03-14 LAB — ACETAMINOPHEN LEVEL: Acetaminophen (Tylenol), Serum: <10 ug/mL — ABNORMAL LOW (ref 10–30)

## 2024-03-14 LAB — CK: Total CK: 124 U/L (ref 38–234)

## 2024-03-14 LAB — SALICYLATE LEVEL: Salicylate Lvl: <7 mg/dL — ABNORMAL LOW (ref 7.0–30.0)

## 2024-03-14 LAB — AMMONIA
Ammonia: 80 umol/L — ABNORMAL HIGH (ref 9–35)
Ammonia: <10 umol/L (ref 9–35)

## 2024-03-14 LAB — CBG MONITORING, ED: Glucose-Capillary: 132 mg/dL — ABNORMAL HIGH (ref 70–99)

## 2024-03-14 LAB — TROPONIN I (HIGH SENSITIVITY): Troponin I (High Sensitivity): 4 ng/L (ref <18)

## 2024-03-14 LAB — LIPASE, BLOOD: Lipase: 26 U/L (ref 11–51)

## 2024-03-14 MED ORDER — HALOPERIDOL LACTATE 5 MG/ML IJ SOLN: 10.0000 mg | Freq: Once | INTRAMUSCULAR | Status: DC

## 2024-03-14 MED ORDER — HALOPERIDOL LACTATE 5 MG/ML IJ SOLN: 10.0000 mg | Freq: Once | INTRAMUSCULAR | Status: AC

## 2024-03-14 MED ORDER — LORAZEPAM 2 MG/ML IJ SOLN
1.0000 mg | Freq: Once | INTRAMUSCULAR | Status: DC
  Filled 2024-03-14: qty 1

## 2024-03-14 MED ORDER — SODIUM CHLORIDE 0.9 % IV BOLUS
1000.0000 mL | Freq: Once | INTRAVENOUS | Status: AC
  Administered 2024-03-14: 1000 mL via INTRAVENOUS

## 2024-03-14 MED ORDER — HALOPERIDOL LACTATE 5 MG/ML IJ SOLN
INTRAMUSCULAR | Status: AC
Start: 2024-03-14 — End: 2024-03-14
  Administered 2024-03-14: 10 mg via INTRAVENOUS
  Filled 2024-03-14: qty 2

## 2024-03-14 NOTE — ED Notes (Signed)
 Pt back to sleep , allowed [place] to draw labs

## 2024-03-14 NOTE — ED Provider Notes (Signed)
 Pt confused and altered since 11pm after drinking heavily and smoking marijuana.  Pt was agitated and violent here even with wife who is in the room and required haldol.  Concern for substance cause vs intracranial issues.  All blood work is reassuring except for elevated ammonia however on recheck it is normal so most likely a clotted lab.  Pt is starting to wake up and was able to walk to the bathroom with minimal help.  No deficits and mental status returning to normal.  9:54 AM Patient appears back to baseline.  She has been able to ambulate to the bathroom multiple times independently.  She has tolerated p.o.'s and has had no vomiting.  She is a little bit drowsy most likely from the 10 of Haldol but otherwise her wife also states that she is back to herself.  Patient did not receive a head CT and does not want that done today.  At this time low suspicion for intracranial hemorrhage.  Patient is hypertensive here however looking back at multiple office visits and the last year patient has been hypertensive at that time 2.  At this time she appears stable for discharge.   Gwyneth Sprout, MD 03/14/24 [phone]

## 2024-03-14 NOTE — ED Notes (Signed)
 Patient actively swatting and attempting to bite staff while staff placing patient on monitoring equipment.   Wife reports patient has been combative with her tonight.

## 2024-03-14 NOTE — ED Notes (Signed)
 MD Rancore at bedside for patient examination.

## 2024-03-14 NOTE — ED Triage Notes (Signed)
 Patient arrived POV from home with wife. Wife reports that patient was drinking 03/13/24, at 2300 patient woke up vomiting. Family reports patient became altered after second time of vomiting.   Patient non verbal on arrival, combative and having difficulty following commands. Family reports patient normally A & O x 4.

## 2024-03-14 NOTE — ED Notes (Signed)
 Pt agitated and fighting nurses and the visitor stating she has to pee , after several mins of protestations from pt we let her get up and walk to BR with assist which she did  pretty well obtained ua and then walked back to room got in bed

## 2024-03-14 NOTE — ED Provider Notes (Signed)
 [facility] Provider Note   CSN: [id number] Arrival date & time: 03/14/24  0553     History  Chief Complaint  Patient presents with   Altered Mental Status    Frances Powell is a 38 y.o. female.  Level 5 caveat for altered mental status.  Required assistance to get out of the car.  Patient not answering questions and history provided by wife.  Wife reports patient was drinking alcohol about 3 PM yesterday.  She drank about a half a pint of cognac which is not unusual for her.  She does drink every day.  Also smokes some weed.  Patient woke up about 10 PM vomiting and has been confused and altered since.  She had several episodes of vomiting at home that were nonbloody with some diarrhea.  Did not fall or hit her head.  Patient not answering questions and not following commands.  Wife denies any other drug use.  Patient's only medical history is hypertension.  She is not diabetic.  No fever or recent illness. CBG 132 on arrival, vitals are stable.  Similar presentation several months ago in the setting of alcohol use.  The history is provided by the patient and the spouse. The history is limited by the condition of the patient.  Altered Mental Status      Home Medications Prior to Admission medications   Medication Sig Start Date End Date Taking? Authorizing Provider  allopurinol (ZYLOPRIM) 100 MG tablet Take 1 tablet (100 mg total) by mouth daily. 02/02/23   Kuneff, Renee A, DO  cyanocobalamin (VITAMIN B12) 1000 MCG tablet Take 1 tablet (1,000 mcg total) by mouth daily. 10/16/22   Kuneff, Renee A, DO  gabapentin (NEURONTIN) 300 MG capsule Take 1 capsule (300 mg total) by mouth 3 (three) times daily. 02/02/23   Kuneff, Renee A, DO  losartan (COZAAR) 100 MG tablet Take 1 tablet (100 mg total) by mouth daily. 02/02/23   Kuneff, Renee A, DO  metoprolol succinate (TOPROL-XL) 50 MG 24 hr tablet Take 1 tablet (50 mg total) by mouth daily. 02/02/23    Kuneff, Renee A, DO  QUEtiapine (SEROQUEL) 25 MG tablet Take 1 tablet (25 mg total) by mouth at bedtime. 02/02/23   Kuneff, Renee A, DO  thiamine (VITAMIN B1) 100 MG tablet Take 1 tablet (100 mg total) by mouth daily. 10/16/22   Kuneff, Renee A, DO  Vitamin D, Cholecalciferol, 25 MCG (1000 UT) TABS Take 1,000 Units by mouth daily. 1 tab daily, except for the day high-dose vitamin D tab is taken 10/16/22   Claiborne Billings, Renee A, DO      Allergies    Patient has no known allergies.    Review of Systems   Review of Systems  Unable to perform ROS: Mental status change    Physical Exam Updated Vital Signs BP (!) 170/108 (BP Location: Right Arm)   Pulse 86   Temp 97.6 F (36.4 C) (Oral)   Resp 18   Ht 5\' 5"  (1.651 m)   Wt 89.8 kg   SpO2 99%   BMI 32.95 kg/m  Physical Exam Vitals and nursing note reviewed.  Constitutional:      General: She is not in acute distress.    Appearance: She is well-developed.     Comments: Confused, does not speak or to follow commands and moves all extremities equally.  Intermittently agitated and attempting to get out of bed.  HENT:     Head: Normocephalic  and atraumatic.     Mouth/Throat:     Pharynx: No oropharyngeal exudate.  Eyes:     Conjunctiva/sclera: Conjunctivae normal.     Pupils: Pupils are equal, round, and reactive to light.     Comments: 3 mm bilateral  Neck:     Comments: No meningismus. Cardiovascular:     Rate and Rhythm: Normal rate and regular rhythm.     Heart sounds: Normal heart sounds. No murmur heard. Pulmonary:     Effort: Pulmonary effort is normal. No respiratory distress.     Breath sounds: Normal breath sounds.  Abdominal:     Palpations: Abdomen is soft.     Tenderness: There is no abdominal tenderness. There is no guarding or rebound.  Musculoskeletal:        General: No tenderness. Normal range of motion.     Cervical back: Normal range of motion and neck supple.  Skin:    General: Skin is warm.  Neurological:      Mental Status: She is alert.     Motor: No abnormal muscle tone.     Comments: Does not speak or answer questions.  Intermittently confused, moves all extremities equally.  Attempted to get out of bed.  Will not follow commands but moves arms and legs independently.     ED Results / Procedures / Treatments   Labs (all labs ordered are listed, but only abnormal results are displayed) Labs Reviewed  CBG MONITORING, ED - Abnormal; Notable for the following components:      Result Value   Glucose-Capillary 132 (*)    All other components within normal limits  CBC WITH DIFFERENTIAL/PLATELET  COMPREHENSIVE METABOLIC PANEL WITH GFR  LIPASE, BLOOD  ETHANOL  AMMONIA  ACETAMINOPHEN LEVEL  SALICYLATE LEVEL  HCG, SERUM, QUALITATIVE  URINALYSIS, ROUTINE W REFLEX MICROSCOPIC  RAPID URINE DRUG SCREEN, HOSP PERFORMED  CK  TROPONIN I (HIGH SENSITIVITY)    EKG EKG Interpretation Date/Time:  Friday March 14 2024 06:16:22 EDT Ventricular Rate:  52 PR Interval:  137 QRS Duration:  97 QT Interval:  485 QTC Calculation: 452 R Axis:   67  Text Interpretation: Sinus rhythm No previous ECGs available Confirmed by Glynn Octave [phone]) on 03/14/2024 6:38:29 AM  Radiology No results found.  Procedures .Critical Care  Performed by: Glynn Octave, MD Authorized by: Glynn Octave, MD   Critical care provider statement:    Critical care time (minutes):  35   Critical care time was exclusive of:  Separately billable procedures and treating other patients   Critical care was necessary to treat or prevent imminent or life-threatening deterioration of the following conditions:  CNS failure or compromise and toxidrome   Critical care was time spent personally by me on the following activities:  Development of treatment plan with patient or surrogate, discussions with consultants, evaluation of patient's response to treatment, examination of patient, ordering and review of laboratory studies,  ordering and review of radiographic studies, ordering and performing treatments and interventions, pulse oximetry, re-evaluation of patient's condition, review of old charts, blood draw for specimens and obtaining history from patient or surrogate   I assumed direction of critical care for this patient from another provider in my specialty: no     Care discussed with: admitting provider       Medications Ordered in ED Medications  sodium chloride 0.9 % bolus 1,000 mL (has no administration in time range)    ED Course/ Medical Decision Making/ A&P  Medical Decision Making Amount and/or Complexity of Data Reviewed Independent Historian: spouse Labs: ordered. Decision-making details documented in ED Course. Radiology: ordered and independent interpretation performed. Decision-making details documented in ED Course. ECG/medicine tests: ordered and independent interpretation performed. Decision-making details documented in ED Course.  Risk Prescription drug management.   Altered mental status after drinking alcohol last night.  Stable vitals.  Patient combative on arrival and not cooperating for evaluation.  Requiring physical and chemical sedation for patient and staff safety.  CBG is 132.  Basic labs to be obtained including CT head. Will check toxicology labs, ethanol level, acetaminophen level, salicylate level, ammonia.  Symptoms may be secondary to alcohol intoxication but need to rule out other etiologies of her altered mental status including hepatic encephalopathy.  CT head to evaluate for intracranial hemorrhage.   Lab work and CT scan pending at shift change.  Patient will need reassessment.  Care transferred to Dr. Anitra Lauth at shift change.         Final Clinical Impression(s) / ED Diagnoses Final diagnoses:  None    Rx / DC Orders ED Discharge Orders     None         Rashard Ryle, Jeannett Senior, MD 03/14/24 [phone]

## 2024-03-14 NOTE — ED Notes (Signed)
 Ambulated to BR, gait steady. Calm, cooperative

## 2024-03-14 NOTE — ED Notes (Signed)
 Per family pt had gone to dr recently  and her labs were " messed up and high: is supposed to go back but doesn't remember when

## 2024-03-14 NOTE — ED Notes (Addendum)
 Drowsy, oriented to name, place, date. Cooperative. Offered po fluids but declined.

## 2024-03-14 NOTE — ED Notes (Signed)
 Drinking well and eating crackers.

## 2024-03-14 NOTE — ED Notes (Incomplete)
 Pt family at beside states they drink everyday

## 2024-03-14 NOTE — Discharge Instructions (Signed)
 All your lab work looked normal today.  If your symptoms return, you start vomiting and cannot stop, have any vision changes, severe terrible headache or recurrent falls return to the emergency room.  Your blood pressure is elevated today and looks like at your doctors visits it has also been elevated and you will need to continue to follow-up to make sure your blood pressure is controlled.

## 2024-03-14 NOTE — ED Notes (Signed)
 Combative with staff, kicking and hitting . States wants to go to BR, walked to BR, significant other at side

## 2024-11-06 ENCOUNTER — Encounter (HOSPITAL_BASED_OUTPATIENT_CLINIC_OR_DEPARTMENT_OTHER): Payer: Self-pay | Admitting: Emergency Medicine

## 2024-11-06 ENCOUNTER — Emergency Department (HOSPITAL_BASED_OUTPATIENT_CLINIC_OR_DEPARTMENT_OTHER)

## 2024-11-06 ENCOUNTER — Other Ambulatory Visit: Payer: Self-pay

## 2024-11-06 ENCOUNTER — Emergency Department (HOSPITAL_BASED_OUTPATIENT_CLINIC_OR_DEPARTMENT_OTHER)
Admission: EM | Admit: 2024-11-06 | Discharge: 2024-11-06 | Disposition: A | Discharge status: Home / Self Care | Attending: Emergency Medicine | Admitting: Emergency Medicine

## 2024-11-06 DIAGNOSIS — I10 Essential (primary) hypertension: Secondary | ICD-10-CM

## 2024-11-06 DIAGNOSIS — F1090 Alcohol use, unspecified, uncomplicated: Secondary | ICD-10-CM

## 2024-11-06 DIAGNOSIS — R4182 Altered mental status, unspecified: Secondary | ICD-10-CM

## 2024-11-06 LAB — URINALYSIS, ROUTINE W REFLEX MICROSCOPIC
Bilirubin Urine: NEGATIVE
Glucose, UA: NEGATIVE mg/dL
Hgb urine dipstick: NEGATIVE
Ketones, ur: NEGATIVE mg/dL
Leukocytes,Ua: NEGATIVE
Nitrite: NEGATIVE
Protein, ur: 30 mg/dL — AB
Specific Gravity, Urine: 1.02 (ref 1.005–1.030)
pH: 7 (ref 5.0–8.0)

## 2024-11-06 LAB — CBC
HCT: 37.4 % (ref 36.0–46.0)
Hemoglobin: 12.7 g/dL (ref 12.0–15.0)
MCH: 32.8 pg (ref 26.0–34.0)
MCHC: 34 g/dL (ref 30.0–36.0)
MCV: 96.6 fL (ref 80.0–100.0)
Platelets: 326 K/uL (ref 150–400)
RBC: 3.87 MIL/uL (ref 3.87–5.11)
RDW: 13.7 % (ref 11.5–15.5)
WBC: 5.3 K/uL (ref 4.0–10.5)
nRBC: 0 % (ref 0.0–0.2)

## 2024-11-06 LAB — CBG MONITORING, ED: Glucose-Capillary: 106 mg/dL — ABNORMAL HIGH (ref 70–99)

## 2024-11-06 LAB — URINALYSIS, MICROSCOPIC (REFLEX)

## 2024-11-06 LAB — COMPREHENSIVE METABOLIC PANEL WITH GFR
ALT: 17 U/L (ref 0–44)
AST: 24 U/L (ref 15–41)
Albumin: 4.7 g/dL (ref 3.5–5.0)
Alkaline Phosphatase: 61 U/L (ref 38–126)
Anion gap: 20 — ABNORMAL HIGH (ref 5–15)
BUN: 8 mg/dL (ref 6–20)
CO2: 20 mmol/L — ABNORMAL LOW (ref 22–32)
Calcium: 9.5 mg/dL (ref 8.9–10.3)
Chloride: 106 mmol/L (ref 98–111)
Creatinine, Ser: 0.84 mg/dL (ref 0.44–1.00)
GFR, Estimated: >60 mL/min (ref >60)
Glucose, Bld: 104 mg/dL — ABNORMAL HIGH (ref 70–99)
Potassium: 3.4 mmol/L — ABNORMAL LOW (ref 3.5–5.1)
Sodium: 146 mmol/L — ABNORMAL HIGH (ref 135–145)
Total Bilirubin: 0.3 mg/dL (ref 0.0–1.2)
Total Protein: 7.6 g/dL (ref 6.5–8.1)

## 2024-11-06 LAB — URINE DRUG SCREEN
Amphetamines: NEGATIVE
Barbiturates: NEGATIVE
Benzodiazepines: NEGATIVE
Cocaine: NEGATIVE
Fentanyl: NEGATIVE
Methadone Scn, Ur: NEGATIVE
Opiates: NEGATIVE
Tetrahydrocannabinol: POSITIVE — AB

## 2024-11-06 LAB — ACETAMINOPHEN LEVEL: Acetaminophen (Tylenol), Serum: <10 ug/mL — ABNORMAL LOW (ref 10–30)

## 2024-11-06 LAB — AMMONIA: Ammonia: 38 umol/L — ABNORMAL HIGH (ref 9–35)

## 2024-11-06 LAB — HCG, SERUM, QUALITATIVE: Preg, Serum: NEGATIVE

## 2024-11-06 LAB — ETHANOL: Alcohol, Ethyl (B): <15 mg/dL (ref <15)

## 2024-11-06 MED ORDER — CHLORDIAZEPOXIDE HCL 25 MG PO CAPS: ORAL_CAPSULE | ORAL | 0 refills | Status: AC

## 2024-11-06 MED ORDER — LORAZEPAM 2 MG/ML IJ SOLN
1.0000 mg | Freq: Once | INTRAMUSCULAR | Status: AC
  Administered 2024-11-06: 1 mg via INTRAVENOUS
  Filled 2024-11-06: qty 1

## 2024-11-06 MED ORDER — CHLORDIAZEPOXIDE HCL 25 MG PO CAPS: ORAL_CAPSULE | ORAL | 0 refills | Status: DC

## 2024-11-06 NOTE — ED Triage Notes (Signed)
 Pt with spouse- spouse reports new confusion  today- spouse was notified by pts employer of confusion appx 1200.    EtOH yesterday. Pt denies etoh today, illicit drug use.   Answers delayed, wrong answers.

## 2024-11-06 NOTE — ED Provider Notes (Signed)
 [place] [facility] Provider Note   CSN: [id number] Arrival date & time: 11/06/24  1417     Patient presents with: Altered Mental Status   Frances Powell is a 38 y.o. female.   Patient with history of daily alcohol use, history of previous ED visits for altered mental status in setting of substance abuse --presents to the emergency department with family member today for altered mental status.  Patient last drank alcohol yesterday.  Also reports THC use.  No falls or injuries.  Patient went to work this morning, was able to drive herself.  She started acting confused with a client around noon.  They called family member, was transported to the hospital.  She is slow to answer questions and generally confused.  Current symptoms are similar to previous.  Patient has had chronic headaches, takes Excedrin.  Also on blood pressure medication.       Prior to Admission medications   Medication Sig Start Date End Date Taking? Authorizing Provider  allopurinol  (ZYLOPRIM ) 100 MG tablet Take 1 tablet (100 mg total) by mouth daily. 02/02/23   Kuneff, Renee A, DO  cyanocobalamin  (VITAMIN B12) 1000 MCG tablet Take 1 tablet (1,000 mcg total) by mouth daily. 10/16/22   Kuneff, Renee A, DO  gabapentin  (NEURONTIN ) 300 MG capsule Take 1 capsule (300 mg total) by mouth 3 (three) times daily. 02/02/23   Kuneff, Renee A, DO  losartan  (COZAAR ) 100 MG tablet Take 1 tablet (100 mg total) by mouth daily. 02/02/23   Kuneff, Renee A, DO  metoprolol  succinate (TOPROL -XL) 50 MG 24 hr tablet Take 1 tablet (50 mg total) by mouth daily. 02/02/23   Kuneff, Renee A, DO  QUEtiapine  (SEROQUEL ) 25 MG tablet Take 1 tablet (25 mg total) by mouth at bedtime. 02/02/23   Kuneff, Renee A, DO  thiamine  (VITAMIN B1) 100 MG tablet Take 1 tablet (100 mg total) by mouth daily. 10/16/22   Kuneff, Renee A, DO  Vitamin D , Cholecalciferol , 25 MCG (1000 UT) TABS Take 1,000 Units by mouth daily. 1 tab daily,  except for the day high-dose vitamin D  tab is taken 10/16/22   Catherine, Renee A, DO    Allergies: Patient has no known allergies.    Review of Systems  Updated Vital Signs BP (!) 160/126 (BP Location: Right Arm)   Pulse 90   Temp 97.7 F (36.5 C) (Oral)   Resp 18   Ht 5' 5 (1.651 m)   SpO2 100%   BMI 32.95 kg/m   Physical Exam Vitals and nursing note reviewed.  Constitutional:      General: She is not in acute distress.    Appearance: She is well-developed.  HENT:     Head: Normocephalic and atraumatic.     Right Ear: External ear normal.     Left Ear: External ear normal.     Nose: Nose normal.     Mouth/Throat:     Mouth: Mucous membranes are moist.  Eyes:     Conjunctiva/sclera: Conjunctivae normal.  Cardiovascular:     Rate and Rhythm: Normal rate and regular rhythm.     Heart sounds: No murmur heard. Pulmonary:     Effort: No respiratory distress.     Breath sounds: No wheezing, rhonchi or rales.  Abdominal:     Palpations: Abdomen is soft.     Tenderness: There is no abdominal tenderness. There is no guarding or rebound.  Musculoskeletal:     Cervical back: Normal range  of motion and neck supple.     Right lower leg: No edema.     Left lower leg: No edema.  Skin:    General: Skin is warm and dry.     Findings: No rash.  Neurological:     General: No focal deficit present.     Mental Status: She is alert. She is disoriented.     Cranial Nerves: No cranial nerve deficit, dysarthria or facial asymmetry.     Motor: No weakness.     Comments: No lateralizing symptoms.  Psychiatric:        Mood and Affect: Mood normal.     (all labs ordered are listed, but only abnormal results are displayed) Labs Reviewed  COMPREHENSIVE METABOLIC PANEL WITH GFR - Abnormal; Notable for the following components:      Result Value   Sodium 146 (*)    Potassium 3.4 (*)    CO2 20 (*)    Glucose, Bld 104 (*)    Anion gap 20 (*)    All other components within normal  limits  URINALYSIS, ROUTINE W REFLEX MICROSCOPIC - Abnormal; Notable for the following components:   Protein, ur 30 (*)    All other components within normal limits  ACETAMINOPHEN  LEVEL - Abnormal; Notable for the following components:   Acetaminophen  (Tylenol ), Serum <10 (*)    All other components within normal limits  AMMONIA - Abnormal; Notable for the following components:   Ammonia 38 (*)    All other components within normal limits  URINE DRUG SCREEN - Abnormal; Notable for the following components:   Tetrahydrocannabinol POSITIVE (*)    All other components within normal limits  URINALYSIS, MICROSCOPIC (REFLEX) - Abnormal; Notable for the following components:   Bacteria, UA RARE (*)    All other components within normal limits  CBG MONITORING, ED - Abnormal; Notable for the following components:   Glucose-Capillary 106 (*)    All other components within normal limits  CBC  ETHANOL  HCG, SERUM, QUALITATIVE  SALICYLATE LEVEL    EKG: None  Radiology: CT Head Wo Contrast Result Date: 11/06/2024 EXAM: CT HEAD WITHOUT CONTRAST 11/06/2024 03:19:00 PM TECHNIQUE: CT of the head was performed without the administration of intravenous contrast. Automated exposure control, iterative reconstruction, and/or weight based adjustment of the mA/kV was utilized to reduce the radiation dose to as low as reasonably achievable. COMPARISON: 07/07/2017. CLINICAL HISTORY: Mental status change, unknown cause. FINDINGS: BRAIN AND VENTRICLES: No acute hemorrhage. No evidence of acute infarct. No hydrocephalus. No extra-axial collection. No mass effect or midline shift. ORBITS: No acute abnormality. SINUSES: No acute abnormality. SOFT TISSUES AND SKULL: No acute soft tissue abnormality. No skull fracture. IMPRESSION: 1. No acute intracranial abnormality. Electronically signed by: Franky Crease MD 11/06/2024 03:42 PM EST RP Workstation: [id number]     Procedures   Medications Ordered in the ED   LORazepam  (ATIVAN ) injection 1 mg (has no administration in time range)   ED Course  Patient seen and examined. History obtained directly from family member.  Patient provides some answers, but family member states that they are incorrect and corrects patient.  Labs/EKG: Due to altered mental status, broad workup including CBC, CMP, ammonia.  Will check toxicology labs including UDS, ethanol, acetaminophen  and salicylate.  Will check urine and UA.    Imaging: Ordered CT head.  Medications/Fluids: Ordered: IV Ativan .  Due to concern for alcohol withdrawal, patient is quite fidgety.  Last time she required Haldol  due to agitation.  Most  recent vital signs reviewed and are as follows: BP (!) 160/126 (BP Location: Right Arm)   Pulse 90   Temp 97.7 F (36.5 C) (Oral)   Resp 18   Ht 5' 5 (1.651 m)   SpO2 100%   BMI 32.95 kg/m   Initial impression: Altered mental status in setting of substance abuse history.  Will screen for other possible causes as well.  5:32 PM Reassessment performed. Patient appears improving.  Labs personally reviewed and interpreted including: CBC unremarkable; CMP mild hyponatremia at 146, mild hypokalemia 3.4, bicarb is at 20 with an anion gap of 20, glucose minimally elevated at 104 otherwise normal renal function and liver function testing; ammonia only minimally elevated at 38; alcohol negative; pregnancy negative; Tylenol  level negative.  UA without compelling signs of infection.  UDS positive for THC.  Imaging personally visualized and interpreted including: CT head agree negative.  Reviewed pertinent lab work and imaging with patient at bedside. Questions answered.   Most current vital signs reviewed and are as follows: BP (!) 182/108 (BP Location: Right Arm)   Pulse 91   Temp 97.7 F (36.5 C) (Oral)   Resp 18   Ht 5' 5 (1.651 m)   SpO2 98%   BMI 32.95 kg/m   Plan: Family member at bedside reports approximately 60% improvement to this point.   She is limited sleepy due to Ativan .  Blood pressure remains elevated, confirmed that patient is on losartan  and another medicine although she cannot remember the name.  She states that she was taken off metoprolol  that is in her chart.  If she continues to improve as per previous episodes, anticipate discharge to home.  Patient discussed with Dr. Cottie.   7:06 PM Reassessment performed. Patient appears improved.  Patient is back at her baseline.  She is sitting on the edge of the bed, conversant.  We discussed outpatient substance abuse referrals and she is willing to consider following up.  She is voicing motivation to decrease her drinking.  We also discussed Librium  taper.  Patient would like this.  Discussed that she needs to take this and not drink alcohol due to concern for respiratory depression.  Family member at bedside is also aware and will help monitor to ensure appropriate use.  Both patient and family member comfortable with discharge to home at this time.  Reviewed pertinent lab work and imaging with patient at bedside. Questions answered.   Most current vital signs reviewed and are as follows: BP (!) 145/56   Pulse 66   Temp 98 F (36.7 C) (Oral)   Resp 18   Ht 5' 5 (1.651 m)   SpO2 99%   BMI 32.95 kg/m   Plan: Discharge to home.   Prescriptions written for: Librium  taper  Other home care instructions discussed: Avoidance of alcohol, close monitoring of symptoms  ED return instructions discussed: Patient counseled to return if they have weakness in their arms or legs, slurred speech, trouble walking or talking, confusion, trouble with their balance, or if they have any other concerns. Patient verbalizes understanding and agrees with plan.   Follow-up instructions discussed: Patient encouraged to follow-up with their PCP in 3 days.                                   Medical Decision Making Amount and/or Complexity of Data Reviewed Labs: ordered. Radiology:  ordered.  Risk Prescription drug management.  Patient with altered mental status, mainly disorientation, normal neuroexam.  She has improved in the emergency department.  Head CT was negative.  Lab workup is reassuring.  Mildly elevated ammonia but normal liver function tests and I have low concern that this is contributing.  Likely the result of substance abuse issues.  Considered alcohol withdrawal, however no severe symptoms.  She is interested in substance abuse referrals and Librium  taper.  We discussed this at bedside as above.  Encouraged return with any worsening or recurrent symptoms.  The patient's vital signs, pertinent lab work and imaging were reviewed and interpreted as discussed in the ED course. Hospitalization was considered for further testing, treatments, or serial exams/observation. However as patient is well-appearing, has a stable exam, and reassuring studies today, I do not feel that they warrant admission at this time. This plan was discussed with the patient who verbalizes agreement and comfort with this plan and seems reliable and able to return to the Emergency Department with worsening or changing symptoms.        Final diagnoses:  Altered mental status, unspecified altered mental status type  Alcohol use disorder  Hypertension, unspecified type    ED Discharge Orders          Ordered    chlordiazePOXIDE  (LIBRIUM ) 25 MG capsule        11/06/24 1902               Desiderio Chew, PA-C 11/06/24 1908    Elnor Jayson LABOR, DO 11/11/24 [id number]

## 2024-11-06 NOTE — Discharge Instructions (Addendum)
 Please read and follow all provided instructions.  Your diagnoses today include:  1. Altered mental status, unspecified altered mental status type   2. Alcohol use disorder   3. Hypertension, unspecified type     Tests performed today include: Complete blood cell count: No concerns Complete metabolic panel: Slightly high sodium and slightly low potassium but no concerns, normal liver function testing Alcohol level was negative Drug screen positive for THC CT scan of the head without any concerning findings Your ammonia level was slightly high but not high enough to I would expect it to cause you to have confusion Vital signs. See below for your results today.   Medications prescribed:  Librium: Medication to help curb alcohol withdrawals.  It is very important that you do not combine this with alcohol and stop taking if you decide to drink.  Take any prescribed medications only as directed.  Home care instructions:  Follow any educational materials contained in this packet.  BE VERY CAREFUL not to take multiple medicines containing Tylenol  (also called acetaminophen ). Doing so can lead to an overdose which can damage your liver and cause liver failure and possibly death.   Follow-up instructions: Please follow-up with your primary care provider in the next 3 days for further evaluation of your symptoms.   Return instructions:  Please return to the Emergency Department if you experience worsening symptoms.  Please return if you have any other emergent concerns.  Additional Information:  Your vital signs today were: BP (!) 145/56   Pulse 66   Temp 98 F (36.7 C) (Oral)   Resp 18   Ht 5' 5 (1.651 m)   SpO2 99%   BMI 32.95 kg/m  If your blood pressure (BP) was elevated above 135/85 this visit, please have this repeated by your doctor within one month. --------------

## 2024-11-07 LAB — SALICYLATE LEVEL: Salicylate Lvl: <7 mg/dL — ABNORMAL LOW (ref 7.0–30.0)
